# Patient Record
Sex: Male | Born: 1980 | Race: Black or African American | Hispanic: No | Marital: Married | Smoking: Never smoker
Health system: Southern US, Community
[De-identification: ages and names within clinical notes are randomized; demographics above are authoritative.]

## PROBLEM LIST (undated history)

## (undated) DIAGNOSIS — S83519A Sprain of anterior cruciate ligament of unspecified knee, initial encounter: Secondary | ICD-10-CM

## (undated) HISTORY — PX: TONSILLECTOMY: SUR1361

---

## 2011-04-02 ENCOUNTER — Emergency Department (HOSPITAL_COMMUNITY)
Admission: EM | Admit: 2011-04-02 | Discharge: 2011-04-02 | Disposition: A | Discharge status: Home / Self Care | Payer: Self-pay | Attending: Emergency Medicine | Admitting: Emergency Medicine

## 2011-04-02 DIAGNOSIS — T148XXA Other injury of unspecified body region, initial encounter: Secondary | ICD-10-CM | POA: Insufficient documentation

## 2011-04-02 DIAGNOSIS — M25569 Pain in unspecified knee: Secondary | ICD-10-CM | POA: Insufficient documentation

## 2011-04-02 DIAGNOSIS — R296 Repeated falls: Secondary | ICD-10-CM | POA: Insufficient documentation

## 2011-04-02 DIAGNOSIS — Y9367 Activity, basketball: Secondary | ICD-10-CM | POA: Insufficient documentation

## 2011-04-08 ENCOUNTER — Ambulatory Visit (INDEPENDENT_AMBULATORY_CARE_PROVIDER_SITE_OTHER): Payer: Self-pay | Admitting: Family Medicine

## 2011-04-08 ENCOUNTER — Encounter: Payer: Self-pay | Admitting: Family Medicine

## 2011-04-08 DIAGNOSIS — M25569 Pain in unspecified knee: Secondary | ICD-10-CM

## 2011-04-08 DIAGNOSIS — S83289A Other tear of lateral meniscus, current injury, unspecified knee, initial encounter: Secondary | ICD-10-CM

## 2011-04-08 NOTE — Assessment & Plan Note (Addendum)
Fragmentation of the lateral meniscus noted on MSK ultrasound, possible partial rupture of medial quadriceps tendon at insertion on the patella, but difficult to fully visualize due to prominent joint effusion. Noted to have chronic calcific change at the proximal patella consistent with previous quadriceps tendinitis/tendinopathy, cannot exclude avulsion of calcification from superior patella. Fitted with Body Helix knee sleeve sample which was comfortable in the office - should wear this at work & with activity. Place on light duty at work for next 3-weeks with avoidance of any deep squatting/knee bends and heavy lifting. Use stationary bike for exercises at least 3-4 days/week. Shown quad exercises including quad sets & straight leg raises. Ice at end of the day. Ibuprofen 800mg  tid x 10-days F/u 3-weeks for re-evaluation

## 2011-04-08 NOTE — Patient Instructions (Signed)
You have a small tear in your lateral meniscus & small partial tear of your quadriceps tendon. No running. You should try to ride stationary bike 30-40 mins 3-4 times a week. Wear knee support with activity and while at work. Do knee exercises - daily: - Quad sets  - Straight leg raises - 3 sets of 30 without weight Take ibuprofen 200mg  4 tabs 3-times a day with food x 10-days, then as needed. Ice at end of the day. Follow-up 3-weeks for re-evaluation.

## 2011-04-09 NOTE — Assessment & Plan Note (Signed)
Fragmentation of the lateral meniscus noted on MSK ultrasound. Plan as stated above.

## 2011-04-09 NOTE — Progress Notes (Signed)
  Subjective:    Patient ID: Alexander Jacobs, male    DOB: 06/17/1981, 30 y.o.   MRN: [id number]  HPI 30yo male to office for evaluation of L knee pain.  DOI: 04/01/11 while playing basketball.  Sustained varus/twisting stress to knee while foot was planted, he was hit in the knee by another player.  Felt a pop & had sensation like knee cap may have been laterally displaced.  Straightened knee & knee cap went back in place.  Had immediate swelling of the knee & pain.  Was able to weight bear following injury.  Evaluated at [facility] 04[facility]/11, no x-rays completed, dx with knee sprain, and instructed to follow-up.  Continues to have lateral knee pain & swelling.  Knee feeling very stiff & has been limping.  Increased pain with try to squat.  Has been using OTC knee sleeve & icing regularly.  Denies any clicking, catching, or locking, does feel slightly unstable.  Not taking any medications for it at this time.  Denies previous injury to knee.  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  No chronic medical conditions NKDA No taking any medications   Review of Systems Per HPI, otherwise negative    Objective:   Physical Exam GEN: AOx3, NAD, pleasant SKIN: no rashes/lesions MSK:  - Knees: Lt knee with (+)effusion & soft tissue swelling.  TTP along lateral joint line, medial patellar facet, & mild TTP along QT at superior pole of patella.  No medial joint line TTP.  Neg Lachman, mild lateral pain with varus/valgus stress testing, but no laxity.  Mildly (+)patellar apprehension.  Mild lateral discomfort with McMurray.  No calf tenderness.  Quad is slightly weak, extensor mechanism is intact.  Rt knee with FROM without pain, swelling, tenderness, weakness, or laxity.  Neg patellar apprehension. - Gait: walking with antalgic gait favoring left leg NEURO: sensation intact to light touch VASC: pulses +2/4 lower ext b/l  MSK U/S: Lt knee- prominent hypoechoic fluid collection in suprapatellar pouch consistent  with effusion.  Irregularity of medial portion of distal quad tendon with possible stump sign, concerning for possible partial quad tendon rupture - difficult to fully visualize due to prominent effusion.  Noted to have calcifications at superior pole of patella consistent with chronic tendinitis/tendinopathy with increased doppler flow, cannot exclude avulsion of calcification from superior pole of patella.  Normal appearing medial meniscus, Lateral meniscus with small area of fragmentation.  Normal appearing patellar tendon & tibial tubercle.  Images saved.      Assessment & Plan:

## 2011-09-23 ENCOUNTER — Ambulatory Visit (INDEPENDENT_AMBULATORY_CARE_PROVIDER_SITE_OTHER): Payer: Self-pay | Admitting: Sports Medicine

## 2011-09-23 VITALS — BP 110/70

## 2011-09-23 DIAGNOSIS — M25569 Pain in unspecified knee: Secondary | ICD-10-CM

## 2011-09-23 DIAGNOSIS — S83289A Other tear of lateral meniscus, current injury, unspecified knee, initial encounter: Secondary | ICD-10-CM

## 2011-09-23 NOTE — Assessment & Plan Note (Signed)
Much less pain  Can use prn otc meds  Use comp sleeve if playing ball

## 2011-09-23 NOTE — Progress Notes (Signed)
  Subjective:    Patient ID: Alexander Jacobs, male    DOB: [birth year], 30 y.o.   MRN: [id number]  HPI 1. Meniscal tear:  Here for follow up of small lateral meniscal tear of the L knee that occurred on 04/01/11.  At previous visit small lateral meniscal tear was visualized on MSK U/S along with possible partial quad tendon rupture.  Exam was difficult 2/2 to effusion at that time.  Since previous visit he has been doing well and says he is around 75% better.  He did "tweak" that knee about a month ago playing basketball when he landed and pivoted and somebody ran into his knee again.  He did have some mild pain at that time but the knee did not swell again.  The knee does not lock or give on him.  He has been wearing his helix brace intermittently.    Review of Systems     Objective:   Physical Exam Knee: Normal to inspection with no erythema or effusion or obvious bony abnormalities. Palpation normal with no warmth or joint line tenderness or patellar tenderness or condyle tenderness. ROM normal in flexion and extension and lower leg rotation. Lachmans with some laxity L>R, but good endpoint. Ligaments with solid consistent endpoints including ACL, PCL, LCL, MCL. Negative Mcmurray's and provocative meniscal tests. Non painful patellar compression. Patellar and quadriceps tendons unremarkable. Hamstring and quadriceps strength is normal.        Assessment & Plan:

## 2011-09-23 NOTE — Assessment & Plan Note (Signed)
He is doing well since the initial injury . -Instructed to work on quad strengthening to protect the knee from reinjury.  Instructed to do half squats for now. -Can return to vigorous activity when he has done strengthening exercises x1 month -He is to always wear helix brace with activity

## 2013-03-28 ENCOUNTER — Other Ambulatory Visit: Payer: Self-pay | Admitting: Pediatrics

## 2014-04-29 ENCOUNTER — Emergency Department (HOSPITAL_COMMUNITY)
Admission: EM | Admit: 2014-04-29 | Discharge: 2014-04-29 | Disposition: A | Discharge status: Home / Self Care | Payer: No Typology Code available for payment source | Attending: Emergency Medicine | Admitting: Emergency Medicine

## 2014-04-29 ENCOUNTER — Emergency Department (HOSPITAL_COMMUNITY): Payer: No Typology Code available for payment source

## 2014-04-29 ENCOUNTER — Encounter (HOSPITAL_COMMUNITY): Payer: Self-pay | Admitting: Emergency Medicine

## 2014-04-29 DIAGNOSIS — S8990XA Unspecified injury of unspecified lower leg, initial encounter: Secondary | ICD-10-CM | POA: Diagnosis present

## 2014-04-29 DIAGNOSIS — X500XXA Overexertion from strenuous movement or load, initial encounter: Secondary | ICD-10-CM | POA: Insufficient documentation

## 2014-04-29 DIAGNOSIS — S8392XA Sprain of unspecified site of left knee, initial encounter: Secondary | ICD-10-CM

## 2014-04-29 DIAGNOSIS — R61 Generalized hyperhidrosis: Secondary | ICD-10-CM | POA: Insufficient documentation

## 2014-04-29 DIAGNOSIS — R42 Dizziness and giddiness: Secondary | ICD-10-CM | POA: Diagnosis not present

## 2014-04-29 DIAGNOSIS — Y9239 Other specified sports and athletic area as the place of occurrence of the external cause: Secondary | ICD-10-CM | POA: Diagnosis not present

## 2014-04-29 DIAGNOSIS — IMO0002 Reserved for concepts with insufficient information to code with codable children: Secondary | ICD-10-CM | POA: Insufficient documentation

## 2014-04-29 DIAGNOSIS — Y9367 Activity, basketball: Secondary | ICD-10-CM | POA: Insufficient documentation

## 2014-04-29 DIAGNOSIS — S99919A Unspecified injury of unspecified ankle, initial encounter: Secondary | ICD-10-CM | POA: Diagnosis present

## 2014-04-29 DIAGNOSIS — R296 Repeated falls: Secondary | ICD-10-CM | POA: Insufficient documentation

## 2014-04-29 DIAGNOSIS — R209 Unspecified disturbances of skin sensation: Secondary | ICD-10-CM | POA: Insufficient documentation

## 2014-04-29 DIAGNOSIS — Y92838 Other recreation area as the place of occurrence of the external cause: Secondary | ICD-10-CM

## 2014-04-29 LAB — CBG MONITORING, ED: Glucose-Capillary: 80 mg/dL (ref 70–99)

## 2014-04-29 MED ORDER — IBUPROFEN 600 MG PO TABS: 600.0000 mg | ORAL_TABLET | Freq: Four times a day (QID) | ORAL | Status: AC | PRN

## 2014-04-29 MED ORDER — OXYCODONE-ACETAMINOPHEN 5-325 MG PO TABS: 1.0000 | ORAL_TABLET | Freq: Three times a day (TID) | ORAL | Status: DC | PRN

## 2014-04-29 MED ORDER — OXYCODONE-ACETAMINOPHEN 5-325 MG PO TABS
1.0000 | ORAL_TABLET | Freq: Once | ORAL | Status: AC
  Administered 2014-04-29: 1 via ORAL
  Filled 2014-04-29: qty 1

## 2014-04-29 NOTE — Discharge Instructions (Signed)
Please call and set-up an appointment with Dr. Eulah Pont, orthopedics,  Please rest and stay hydrated Please take Tylenol as needed for pain-no more than 4000 mg/4 g per day for this can lead to liver and Tylenol overdose which is fatal. Please continue to ice, elevate and rest Please strenuous or physical activity Please apply knee immobilizer and use crutches when walking When resting please prop leg up with pillows and apply pillows underneath the knee in a bent fashion to prevent stiffening of the joint Please continue to monitor symptoms closely and if symptoms are to worsen or change (fever greater than 101, chills, chest pain, shortness of breath, difficulty breathing, numbness, tingling, fall, injury, red streaks running down the leg, loss of sensation, warmth upon palpation) please report back to the ED immediately  Sprain A sprain is a tear in one of the strong, fibrous tissues that connect your bones (ligaments). The severity of the sprain depends on how much of the ligament is torn. The tear can be either partial or complete. CAUSES  Often, sprains are a result of a fall or an injury. The force of the impact causes the fibers of your ligament to stretch beyond their normal length. This excess tension causes the fibers of your ligament to tear. SYMPTOMS  You may have some loss of motion or increased pain within your normal range of motion. Other symptoms include:  Bruising.  Tenderness.  Swelling. DIAGNOSIS  In order to diagnose a sprain, your caregiver will physically examine you to determine how torn the ligament is. Your caregiver may also suggest an X-ray exam to make sure no bones are broken. TREATMENT  If your ligament is only partially torn, treatment usually involves keeping the injured area in a fixed position (immobilization) for a short period. To do this, your caregiver will apply a bandage, cast, or splint to keep the area from moving until it heals. For a partially torn  ligament, the healing process usually takes 2 to 3 weeks. If your ligament is completely torn, you may need surgery to reconnect the ligament to the bone or to reconstruct the ligament. After surgery, a cast or splint may be applied and will need to stay on for 4 to 6 weeks while your ligament heals. HOME CARE INSTRUCTIONS  Keep the injured area elevated to decrease swelling.  To ease pain and swelling, apply ice to your joint twice a day, for 2 to 3 days.  Put ice in a plastic bag.  Place a towel between your skin and the bag.  Leave the ice on for 15 minutes.  Only take over-the-counter or prescription medicine for pain as directed by your caregiver.  Do not leave the injured area unprotected until pain and stiffness go away (usually 3 to 4 weeks).  Do not allow your cast or splint to get wet. Cover your cast or splint with a plastic bag when you shower or bathe. Do not swim.  Your caregiver may suggest exercises for you to do during your recovery to prevent or limit permanent stiffness. SEEK IMMEDIATE MEDICAL CARE IF:  Your cast or splint becomes damaged.  Your pain becomes worse. MAKE SURE YOU:  Understand these instructions.  Will watch your condition.  Will get help right away if you are not doing well or get worse. Document Released: 12/12/2000 Document Revised: 03/08/2012 Document Reviewed: 12/27/2011 [facility] Patient Information 2014 [place], [place].   Emergency Department Resource Guide 1) Find a Doctor and Pay Out of Pocket Although you  won't have to find out who is covered by your insurance plan, it is a good idea to ask around and get recommendations. You will then need to call the office and see if the doctor you have chosen will accept you as a new patient and what types of options they offer for patients who are self-pay. Some doctors offer discounts or will set up payment plans for their patients who do not have insurance, but you will need to ask so you aren't  surprised when you get to your appointment.  2) Contact Your Local Health Department Not all health departments have doctors that can see patients for sick visits, but many do, so it is worth a call to see if yours does. If you don't know where your local health department is, you can check in your phone book. The CDC also has a tool to help you locate your state's health department, and many state websites also have listings of all of their local health departments.  3) Find a Walk-in Clinic If your illness is not likely to be very severe or complicated, you may want to try a walk in clinic. These are popping up all over the country in pharmacies, drugstores, and shopping centers. They're usually staffed by nurse practitioners or physician assistants that have been trained to treat common illnesses and complaints. They're usually fairly quick and inexpensive. However, if you have serious medical issues or chronic medical problems, these are probably not your best option.  No Primary Care Doctor: - Call Health Connect at  [phone] - they can help you locate a primary care doctor that  accepts your insurance, provides certain services, etc. - Physician Referral Service- [phone]  Chronic Pain Problems: Organization         Address  Phone   Notes  [facility]  [phone] Patients need to be referred by their primary care doctor.   Medication Assistance: Organization         Address  Phone   Notes  [facility] Medication [facility] [address] [place], [place] [address] [phone] --Must be a resident of [facility] -- Must have NO insurance coverage whatsoever (no Medicaid/ Medicare, etc.) -- The pt. MUST have a primary care doctor that directs their care regularly and follows them in the community   MedAssist  [phone]) [phone]   [employer]  [phone]    Agencies that provide inexpensive medical care: Organization          Address  Phone   Notes  [facility]  [phone]   [facility]    [phone]   [facility] [address] [place], [place] [address] [phone]   [facility] [address], [place] [phone]   [facility]anned Parenthood    [phone]) [phone]   [facility]    [phone]   [facility]  [address], [place] Phone:  [phone], Fax:  [phone] Hours of Operation:  9 am - 6 pm, M-F.  Also accepts Medicaid/Medicare and self-pay.  [facility]  [address], [place] Phone: [phone], Fax: [phone]. Hours of Operation:  8:30 am - 5:30 pm, M-F.  Also accepts Medicaid and self-pay.  [facility] [place] [address], [place] Phone: [phone]) [phone]   [facility] [address], [place], [place] [phone], Ext. [phone] Mondays & Thursdays: 7-9 AM.  First 15 patients are seen on a first come, first serve basis.    Medicaid-accepting [facility] Providers:  Organization         Address  Phone   Notes  [facility] [address], [place] [phone] Also accepts self-pay patients.  [facility] [address], [place]  [phone]   [facility] [address], [place] [phone]   [facility] [address] [place] [address], [place] [phone]   Renaye RakersVeita Bland [address], [place]   [phone]) [phone] Only accepts [place] Access [place] patients after they have their name applied to their card.   Self-Pay (no insurance) in [facility] [place]:  Organization         Address  Phone   Notes  Sickle Cell Patients, [facility] [address], [place] [phone]   [facility] [address], [place] [phone]     [facility]  [address], [place] [phone]) [phone]   [facility]/Dr. Osei-Bonsu  [address] [place] [address], [place] or [phone], [place] [phone]) [phone] Phone number for both [place] and [place] locations is the same.  [facility] [address], [place] [phone]) [phone]   [facility] [place] [address] [place] [address], [place] or [address] [phone]   [facility] [address], [place] [phone], phone; [phone], fax Sees patients 1st and 3rd Saturday of every month.  Must not qualify for public or private insurance (i.e. Medicaid, Medicare, [place] Health Choice, Veterans' Benefits)  Household income should be no more than 200% of the poverty level The clinic cannot treat you if you are pregnant or think you are pregnant  Sexually transmitted diseases are not treated at the clinic.    Dental Care: Organization         Address  Phone  Notes  [facility] [address], [place] [phone] Accepts children up to age 31 who are enrolled in [place]icaid or [place] Health Choice; pregnant women with a Medicaid card; and children who have applied for Medicaid or [place] Health Choice, but were declined, whose parents can pay a reduced fee at time of service.  [facility] [place]  [address], [place] [phone]) [phone] Accepts children up to age 31 who are enrolled in [place] or [place] Health Choice; pregnant women with a Medicaid card; and children who have applied for Medicaid or [place] Health Choice, but were declined, whose parents can pay a reduced fee at time of service.  [facility]  [address], [place] [phone] Patients are seen by appointment only. Walk-ins are not accepted. [facility] will see patients 3918  years of age and older. Monday - Tuesday (8am-5pm) Most Wednesdays (8:30-5pm) $30 per visit, cash only  [facility]  [address], [facility] [phone] Patients are seen by appointment only. Walk-ins are not accepted. [facility] will see patients 31 years of age and older. One Wednesday Evening (Monthly: Volunteer Based).  $30 per visit, cash only  [employer]  [phone] for adults; Children under age 42, call Graduate Pediatric Dentistry at [phone]. Children aged 66-14, please call [phone] to request a pediatric application.  Dental services are provided in all areas of dental care including fillings, crowns and bridges, complete and partial dentures, implants, gum treatment, root canals, and extractions. Preventive care is also provided. Treatment is provided to both adults and children. Patients are selected via a lottery and there is often a waiting list.   [facility] [address], [place]  [phone] [email or web address]   [facility] [address] [place], [place] [phone], Ext. [phone] Second and Fourth Thursday of each month, opens at 6:30 AM; Clinic ends at 9 AM.  Patients are seen on a first-come first-served basis, and a limited number are seen during each clinic.   [facility]  [address] [place]ard D[place]er [place], [place] [phone]   Eligibility Requirements You must have lived in [place], [place], or [place] counties for at least the last three months.   You cannot be eligible for state or federal sponsored [employer], including [employer], [place], or [employer].   You generally cannot be eligible for healthcare insurance through your employer.    How to apply: Eligibility screenings are held every Tuesday and Wednesday afternoon from 1:00 pm until 4:00 pm. You do not need an appointment for the interview!  [facility]  [address] [place], [place]   [place]  [place] Department  [place]  [phone]    Behavioral Health Resources in the Community: Intensive Outpatient Programs Organization         Address  Phone  Notes  [place]. [address], [place], [place] [phone]   [facility] [address] [place], [place]   ADS: Alcohol & Drug S[facility]cs [address] [place], [place]   [place] [address],  [place], [place] or [phone]   Substance Abuse Resources Organization         Address  Phone  Notes  Alcohol and Drug Services  [phone]   [place]  [phone]   The [place]   Chinita Pester  [phone]   Residential & Outpatient Substance Abuse Program  [phone]   Psychological Services Organization         Address  Phone  Notes  [facility] [place]  [place]  [phone]   [place] [address], [place] or [phone]    Mobile Crisis Teams Organization         Address  Phone  Notes  [facility], Mobile Crisis Care Unit  [phone]   [facility]  [address] [place], [place]   [place] Levels [address] [place] [phone]    Self-Help/Support Groups Organization         Address  Phone             Notes  [place]. of [place] - variety of support groups  [place] Call for more information  Narcotics Anonymous (NA), [facility]aring [facility] [address], [employer] [place]  2 meetings at this location   Special educational needs teacher  Address  Phone  Notes  [facility] [address],    [place]   [phone]   [facility]  [address], [place] [address]04, [place], [place]   [place], [place] [phone] Admissions: 8am-3pm M-F  Incentives Substance [place] [address],    [place], [place] [phone]   [facility] [address] [place], [place], [place]   [facility] [address]  [place], [place]   [facility] - Intensive Outpatient [address], [place], [place]   [facility] ([place] [address].,  [place], [place] [phone] or [phone]   [facility] Services (RTS) [address], [place], [place] Accepts Medicaid  [facility] [place] [address],  [place] [phone] Substance Abuse/Addiction Treatment   [facility] Organization         Address  Phone  Notes  [facility]  [phone]   Domenic Schwab, PhD [address] [place], [place]   [phone] or [phone]   [place] [address] [place], [place] [phone]   [facility] [address], [place], [place] [phone] Insurance/Medicaid/sponsorship through [facility] [address]                                    [place], [place] [phone] [place] [address] Street[place], [place] [phone]    Dr. Adele Schilder  [phone]   [facility] Dept. 1) [address] [place] 2) [place] 3)  [address], [place] [phone]  [phone]   [place] [phone] or [phone] (After Hours)

## 2014-04-29 NOTE — ED Notes (Signed)
Due to heart rate and diaphoresis, pt was given 500cc po soda and ice, a sandwich,  Crackers and peanut butter. Pt remains alert and appropriate

## 2014-04-29 NOTE — ED Notes (Signed)
Ambulated in hall on crutches, PA observed. Pt was shown how to check his pulse. Will follow up by locating a PCP and completing a physical

## 2014-04-29 NOTE — ED Notes (Signed)
Pt c/o lt knee pain after falling and twisting it on Thursday.

## 2014-04-29 NOTE — ED Notes (Signed)
Pt denies pain or shortness of breath. Continues to take po fluids. Family at bedside

## 2014-04-29 NOTE — ED Notes (Signed)
Pt c/o feeling lightheaded, dizzy, sweaty. Denies chest pain or nausea. VS WNL. Continue po fluids. PA and EDP at bedside

## 2014-04-29 NOTE — ED Provider Notes (Signed)
CSN: [id number]     Arrival date & time 04/29/14  [id number] History   First MD Initiated Contact with Patient 04/29/14 1006     Chief Complaint  Patient presents with  . Knee Pain     (Consider location/radiation/quality/duration/timing/severity/associated sxs/prior Treatment) Patient is a 33 y.o. male presenting with knee pain. The history is provided by the patient. No language interpreter was used.  Knee Pain Associated symptoms: no fever   Alexander Jacobs is a 33 year old male with no known significant past medical history presenting to the ED with left knee pain. Patient reports that the knee pain has been ongoing since Thursday-reported that while playing basketball patient landed on his left knee an awkward manner, landing on the lateral aspects. Reported that as he fell he heard a "crunching" sound coming from the knee. Stated that the pain is localized to the medial aspect of the left knee with soreness to touch and shooting pain with motion that radiates down to his calf. Patient reported that he has been not been using anything for the pain. Stated that in 2012 patient had a meniscal tear in his left knee that was not surgically repaired, patient went through physical therapy. Denied numbness, tingling, loss of sensation, warmth upon palpation, erythema. PCP none   History reviewed. No pertinent past medical history. History reviewed. No pertinent past surgical history. History reviewed. No pertinent family history. History  Substance Use Topics  . Smoking status: Never Smoker   . Smokeless tobacco: Not on file  . Alcohol Use: No    Review of Systems  Constitutional: Negative for fever and chills.  Eyes: Negative for visual disturbance.  Respiratory: Negative for chest tightness and shortness of breath.   Cardiovascular: Negative for chest pain.  Musculoskeletal: Positive for arthralgias (Left knee pain). Negative for gait problem and myalgias.  Neurological: Negative for weakness  and numbness.  All other systems reviewed and are negative.     Allergies  Percocet  Home Medications   Prior to Admission medications   Not on File   BP 112/61  Pulse 49  Temp(Src) 98 F (36.7 C) (Oral)  Resp 18  SpO2 100% Physical Exam  Nursing note and vitals reviewed. Constitutional: He is oriented to person, place, and time. He appears well-developed and well-nourished. No distress.  HENT:  Head: Normocephalic and atraumatic.  Eyes: Conjunctivae and EOM are normal. Right eye exhibits no discharge. Left eye exhibits no discharge.  Neck: Normal range of motion. Neck supple.  Cardiovascular: Normal rate, regular rhythm and normal heart sounds.  Exam reveals no friction rub.   No murmur heard. Pulses:      Radial pulses are 2+ on the right side, and 2+ on the left side.       Dorsalis pedis pulses are 2+ on the right side, and 2+ on the left side.  Pulmonary/Chest: Effort normal and breath sounds normal. No respiratory distress. He has no wheezes. He has no rales.  Musculoskeletal: He exhibits edema and tenderness.       Left knee: He exhibits decreased range of motion and swelling. He exhibits no effusion, no ecchymosis, no deformity, no laceration and no erythema. Tenderness found. Medial joint line and MCL tenderness noted.       Legs: Mild swelling localized to the medial aspect of the left knee - negative erythema, inflammation, lesions, sores, warmth upon palpation, red streaks. Negative signs of septic joint. Pain upon palpation to the medial aspect. Negative crepitus upon palpation. Flexion and  extension intact - minimal - secondary to pain, negative anterior and posterior draw sign. Negative varus. Positive pain with valgus tension.  Full ROM to the left hip, ankle, and digits of the left foot.    Neurological: He is alert and oriented to person, place, and time. No cranial nerve deficit. He exhibits normal muscle tone. Coordination normal.  Cranial nerves III-XII  grossly intact Strength 5+/5+ to lower extremities bilaterally with resistance applied, equal distribution noted Strength intact to digits of the left feet bilaterally with equal distribution Sensation intact with differentiation sharp and dull touch Gait proper - mild limp when applying pressure to the left leg  Skin: Skin is warm and dry. No rash noted. He is not diaphoretic. No erythema.    ED Course  Procedures (including critical care time)  12:56 PM Patient not feeling well from the percocets -prescription discontinued. Patient's heart rate dropped from 75 beats per minute to approximately 55 beats per minute. Patient reported that he was feeling diaphoretic, dizzy, tingling sensations all over his body. Patient lay down, was given ginger ale and something to eat. Patient reported he has not eaten all day. Attending, Dr. Wilkie AyeHorton at bedside-assess patient recommended patient to be monitored. Reported that this is normal when taking pain medications. CBG 80.  1:56 PM Patient has been given food and fluids area patient has been monitored for at least one hour and has been hooked up to the monitor. Patient appears well. Patient denied dizziness, chest pain, shortness of breath, difficulty breathing, blurred vision, lightheadedness. Patient reported that he is feeling well. Stated that he is able to walk well. Ambulated well without difficulty. Patient appears well. Vitals rechecked. Patient stable, afebrile. Patient discharged home. Percocet allergy put computer for future reference.  Labs Review Labs Reviewed  CBG MONITORING, ED    Imaging Review Ct Knee Left Wo Contrast  04/29/2014   CLINICAL DATA:  Knee pain.  Twisting injury.  EXAM: CT OF THE LEFT KNEE WITHOUT CONTRAST  TECHNIQUE: Multidetector CT imaging was performed according to the standard protocol. Multiplanar CT image reconstructions were also generated.  COMPARISON:  Radiographs 04/29/2014.  FINDINGS: Age advanced degenerative  changes with joint space narrowing and osteophytic spurring. No acute fracture is identified. No osteochondral lesion. There is a moderate to large joint effusion. Calcifications are noted in the distal quadriceps tendon. No obvious quadriceps or patellar tendon rupture.  IMPRESSION: No acute bony findings.  Age advanced degenerative changes.  Moderate to large joint effusion.   Electronically Signed   By: Loralie ChampagneMark  Gallerani M.D.   On: 04/29/2014 11:45   Dg Knee Complete 4 Views Left  04/29/2014   CLINICAL DATA:  Medial left knee pain.  Fall 2 days ago.  EXAM: LEFT KNEE - COMPLETE 4+ VIEW  COMPARISON:  None.  FINDINGS: No acute fracture or dislocation is identified. There is a moderately large knee joint effusion. Mild tricompartmental osteophyte formation is present. A prominent superior patellar enthesophyte is noted.  IMPRESSION: 1. No acute fracture or dislocation identified. 2. Moderately large knee joint effusion. 3. Mild tricompartmental osteoarthrosis.   Electronically Signed   By: Sebastian AcheAllen  Grady   On: 04/29/2014 10:28     EKG Interpretation None      MDM   Final diagnoses:  Sprain of left knee  Injury of ligament of knee   Medications  oxyCODONE-acetaminophen (PERCOCET/ROXICET) 5-325 MG per tablet 1 tablet (1 tablet Oral Given 04/29/14 1206)   Filed Vitals:   04/29/14 1248 04/29/14 1259 04/29/14  1301 04/29/14 1325  BP: 120/65 127/80 113/65 112/61  Pulse:  70 56 49  Temp:      TempSrc:      Resp:  20 18 18   SpO2:  100%      Plain films of the left knee identified no acute fracture or dislocation. A moderate large knee joint effusion is identified associated with mild tricompartmental osteoarthrosis. Patient neurovascularly intact. Patient is able to apply pressure to the left knee and able to ambulate with mild limp. Strength intact with equal distribution. Negative posterior and anterior draw sign. Negative crepitus with motion. Flexion and extension is intact with mild discomfort  noted. CT left knee without contrast negative for acute bony abnormalities-negative findings of tibial plateau fracture. Degenerative changes identified with moderate to large joint effusion. Negative findings of acute osseous injury. Negative findings of quadriceps or patellar tendon rupture. Patient neurovascularly intact. Negative focal neurological deficits noted. Patient able to angulate onlay. Cannot rule out possible MCL or meniscal injury. Patient placed in knee immobilizer. Crutches administered. Discussed with patient to rest, ice, elevate. At first this provider discharged the patient with pain medications, but patient became hypotensive and bradycardia with dizziness while on the medication that was given while in the ED setting - medications discontinued, allergy placed in patient's chart and patient discharged home with Ibuprofen. Referred patient to orthopedics discussed with patient that he will most likely need a MRI of his left knee within the near future. Discussed with patient to avoid any physical or strenuous activity. Discussed with patient to closely monitor symptoms and if symptoms are to worsen or change to report back to the ED - strict return instructions given.  Patient agreed to plan of care, understood, all questions answered.   Alexander Mutton, PA-C 04/29/14 2048

## 2014-04-30 NOTE — ED Provider Notes (Signed)
Medical screening examination/treatment/procedure(s) were performed by non-physician practitioner and as supervising physician I was immediately available for consultation/collaboration.   EKG Interpretation None       Courtney F Horton, MD 04/30/14 0707 

## 2014-05-29 DIAGNOSIS — S83519A Sprain of anterior cruciate ligament of unspecified knee, initial encounter: Secondary | ICD-10-CM

## 2014-05-29 HISTORY — DX: Sprain of anterior cruciate ligament of unspecified knee, initial encounter: S83.519A

## 2014-06-04 NOTE — H&P (Signed)
  [facility] [address]   [address] [place],  [address] [phone] A Division of [facility]  Loreta Ave, M.D.   Robert A. Thurston Hole, M.D.   Burnell Blanks, M.D.   Eulas Post, M.D.   Lunette Stands, M.D Jewel Baize. Eulah Pont, M.D.  Buford Dresser, M.D.  Estell Harpin, M.D.    Melina Fiddler, M.D. Mary L. Isidoro Donning, PA-C  Kirstin A. Shepperson, PA-C  Josh [place] Horn, PA-C [place], [place]   RE: Alexander Jacobs, Alexander Jacobs   [id number]      DOB: [birth year] PROGRESS NOTE: 05-17-14 Reason for visit:  Follow-up MRI left knee. History of present illness: He suffered an injury on 04/27/14 playing basketball to his left knee. His pain is improved swelling has gone down. He has been using crutches.   Please see associated documentation for this clinic visit for further past medical, family, surgical and social history, review of systems, and exam findings as this was reviewed by me.  EXAMINATION: Well appearing male in no apparent distress. Left knee has tenderness and residual effusion, unstable to [place]man now that he is not guarding as much. He is neurovascularly intact distally.  IMAGING: MRI demonstrates posterior medial and lateral meniscus tears chondral injury to the lateral femoral condyle and ACL tear.  ASSESSMENT/PLAN: 1. I had a lengthy discussion with him. He is very active and wants to return to basketball. 2. Given his injuries I recommend meniscal debridement versus repair with possible microfracture lateral femoral condyle and ACL reconstruction, I discussed both allo and autograft and he would like an allograft reconstruction. 3. I discuss with him that should he require microfracture or meniscal repair he will be non-weightbearing for 6 weeks.  Jewel Baize.  Eulah Pont, M.D.  Electronically verified by Jewel Baize. Eulah Pont, M.D. TDM:kah D 05-17-14 T 05-18-14

## 2014-06-05 ENCOUNTER — Encounter (HOSPITAL_BASED_OUTPATIENT_CLINIC_OR_DEPARTMENT_OTHER): Payer: Self-pay | Admitting: *Deleted

## 2014-06-09 ENCOUNTER — Ambulatory Visit (HOSPITAL_BASED_OUTPATIENT_CLINIC_OR_DEPARTMENT_OTHER): Payer: No Typology Code available for payment source | Admitting: Anesthesiology

## 2014-06-09 ENCOUNTER — Encounter (HOSPITAL_BASED_OUTPATIENT_CLINIC_OR_DEPARTMENT_OTHER): Payer: Self-pay | Admitting: *Deleted

## 2014-06-09 ENCOUNTER — Encounter (HOSPITAL_BASED_OUTPATIENT_CLINIC_OR_DEPARTMENT_OTHER): Payer: No Typology Code available for payment source | Admitting: Anesthesiology

## 2014-06-09 ENCOUNTER — Ambulatory Visit (HOSPITAL_BASED_OUTPATIENT_CLINIC_OR_DEPARTMENT_OTHER)
Admission: RE | Admit: 2014-06-09 | Discharge: 2014-06-09 | Disposition: A | Discharge status: Home / Self Care | Payer: No Typology Code available for payment source | Source: Ambulatory Visit | Attending: Orthopedic Surgery | Admitting: Orthopedic Surgery

## 2014-06-09 ENCOUNTER — Encounter (HOSPITAL_BASED_OUTPATIENT_CLINIC_OR_DEPARTMENT_OTHER)
Admission: RE | Disposition: A | Discharge status: Home / Self Care | Payer: Self-pay | Source: Ambulatory Visit | Attending: Orthopedic Surgery

## 2014-06-09 DIAGNOSIS — M2350 Chronic instability of knee, unspecified knee: Secondary | ICD-10-CM | POA: Insufficient documentation

## 2014-06-09 DIAGNOSIS — M13169 Monoarthritis, not elsewhere classified, unspecified knee: Secondary | ICD-10-CM | POA: Diagnosis not present

## 2014-06-09 DIAGNOSIS — M23302 Other meniscus derangements, unspecified lateral meniscus, unspecified knee: Secondary | ICD-10-CM | POA: Insufficient documentation

## 2014-06-09 DIAGNOSIS — M23305 Other meniscus derangements, unspecified medial meniscus, unspecified knee: Secondary | ICD-10-CM | POA: Diagnosis not present

## 2014-06-09 HISTORY — DX: Sprain of anterior cruciate ligament of unspecified knee, initial encounter: S83.519A

## 2014-06-09 HISTORY — PX: KNEE ARTHROSCOPY WITH ANTERIOR CRUCIATE LIGAMENT (ACL) REPAIR WITH HAMSTRING GRAFT: SHX5645

## 2014-06-09 LAB — POCT HEMOGLOBIN-HEMACUE: Hemoglobin: 15.8 g/dL (ref 13.0–17.0)

## 2014-06-09 SURGERY — KNEE ARTHROSCOPY WITH ANTERIOR CRUCIATE LIGAMENT (ACL) REPAIR WITH HAMSTRING GRAFT
Anesthesia: General | Site: Knee | Laterality: Left

## 2014-06-09 MED ORDER — LACTATED RINGERS IV SOLN
INTRAVENOUS | Status: DC
  Administered 2014-06-09 (×2): via INTRAVENOUS

## 2014-06-09 MED ORDER — ACETAMINOPHEN 500 MG PO TABS
1000.0000 mg | ORAL_TABLET | Freq: Once | ORAL | Status: AC
  Administered 2014-06-09: 1000 mg via ORAL

## 2014-06-09 MED ORDER — HYDROMORPHONE HCL PF 1 MG/ML IJ SOLN
INTRAMUSCULAR | Status: AC
Start: 2014-06-09 — End: 2014-06-09
  Filled 2014-06-09: qty 1

## 2014-06-09 MED ORDER — HYDROMORPHONE HCL PF 1 MG/ML IJ SOLN
INTRAMUSCULAR | Status: AC
  Filled 2014-06-09: qty 1

## 2014-06-09 MED ORDER — ONDANSETRON HCL 4 MG PO TABS: 4.0000 mg | ORAL_TABLET | Freq: Three times a day (TID) | ORAL | Status: AC | PRN

## 2014-06-09 MED ORDER — CEFAZOLIN SODIUM 1-5 GM-% IV SOLN
INTRAVENOUS | Status: AC
  Filled 2014-06-09: qty 100

## 2014-06-09 MED ORDER — PROPOFOL 10 MG/ML IV BOLUS
INTRAVENOUS | Status: DC | PRN
  Administered 2014-06-09: 200 mg via INTRAVENOUS

## 2014-06-09 MED ORDER — LIDOCAINE HCL (CARDIAC) 20 MG/ML IV SOLN
INTRAVENOUS | Status: DC | PRN
  Administered 2014-06-09: 40 mg via INTRAVENOUS

## 2014-06-09 MED ORDER — SODIUM CHLORIDE 0.9 % IR SOLN
Status: DC | PRN
  Administered 2014-06-09: 9000 mL

## 2014-06-09 MED ORDER — METHYLPREDNISOLONE ACETATE 80 MG/ML IJ SUSP
INTRAMUSCULAR | Status: DC | PRN
  Administered 2014-06-09: 80 mg

## 2014-06-09 MED ORDER — MIDAZOLAM HCL 5 MG/5ML IJ SOLN
INTRAMUSCULAR | Status: DC | PRN
  Administered 2014-06-09: 2 mg via INTRAVENOUS

## 2014-06-09 MED ORDER — FENTANYL CITRATE 0.05 MG/ML IJ SOLN
INTRAMUSCULAR | Status: AC
  Filled 2014-06-09: qty 6

## 2014-06-09 MED ORDER — BUPIVACAINE-EPINEPHRINE (PF) 0.5% -1:200000 IJ SOLN
INTRAMUSCULAR | Status: DC | PRN
  Administered 2014-06-09: 25 mL via PERINEURAL

## 2014-06-09 MED ORDER — FENTANYL CITRATE 0.05 MG/ML IJ SOLN
50.0000 ug | INTRAMUSCULAR | Status: DC | PRN
  Administered 2014-06-09: 100 ug via INTRAVENOUS

## 2014-06-09 MED ORDER — MIDAZOLAM HCL 2 MG/2ML IJ SOLN
1.0000 mg | INTRAMUSCULAR | Status: DC | PRN
  Administered 2014-06-09: 2 mg via INTRAVENOUS

## 2014-06-09 MED ORDER — ONDANSETRON HCL 4 MG/2ML IJ SOLN
INTRAMUSCULAR | Status: DC | PRN
  Administered 2014-06-09: 4 mg via INTRAVENOUS

## 2014-06-09 MED ORDER — MIDAZOLAM HCL 2 MG/2ML IJ SOLN
INTRAMUSCULAR | Status: AC
  Filled 2014-06-09: qty 2

## 2014-06-09 MED ORDER — BUPIVACAINE HCL (PF) 0.25 % IJ SOLN
INTRAMUSCULAR | Status: AC
  Filled 2014-06-09: qty 30

## 2014-06-09 MED ORDER — FENTANYL CITRATE 0.05 MG/ML IJ SOLN
INTRAMUSCULAR | Status: AC
  Filled 2014-06-09: qty 2

## 2014-06-09 MED ORDER — ACETAMINOPHEN 500 MG PO TABS
ORAL_TABLET | ORAL | Status: AC
  Filled 2014-06-09: qty 2

## 2014-06-09 MED ORDER — OXYCODONE HCL 5 MG PO TABS: 10.0000 mg | ORAL_TABLET | ORAL | Status: AC | PRN

## 2014-06-09 MED ORDER — DOCUSATE SODIUM 100 MG PO CAPS: 100.0000 mg | ORAL_CAPSULE | Freq: Two times a day (BID) | ORAL | Status: AC

## 2014-06-09 MED ORDER — HYDROMORPHONE HCL PF 1 MG/ML IJ SOLN
0.2500 mg | INTRAMUSCULAR | Status: DC | PRN
Start: 2014-06-09 — End: 2014-06-09
  Administered 2014-06-09 (×3): 0.5 mg via INTRAVENOUS

## 2014-06-09 MED ORDER — ONDANSETRON HCL 4 MG/2ML IJ SOLN
INTRAMUSCULAR | Status: AC
  Filled 2014-06-09: qty 2

## 2014-06-09 MED ORDER — ASPIRIN 81 MG PO TABS: 81.0000 mg | ORAL_TABLET | Freq: Every day | ORAL | Status: AC

## 2014-06-09 MED ORDER — MIDAZOLAM HCL 2 MG/ML PO SYRP: 12.0000 mg | ORAL_SOLUTION | Freq: Once | ORAL | Status: DC | PRN

## 2014-06-09 MED ORDER — CEFAZOLIN SODIUM-DEXTROSE 2-3 GM-% IV SOLR: 2.0000 g | INTRAVENOUS | Status: DC

## 2014-06-09 MED ORDER — DEXTROSE-NACL 5-0.45 % IV SOLN: 100.0000 mL/h | INTRAVENOUS | Status: DC

## 2014-06-09 MED ORDER — BUPIVACAINE HCL (PF) 0.5 % IJ SOLN
INTRAMUSCULAR | Status: AC
  Filled 2014-06-09: qty 30

## 2014-06-09 MED ORDER — FENTANYL CITRATE 0.05 MG/ML IJ SOLN
INTRAMUSCULAR | Status: DC | PRN
  Administered 2014-06-09 (×2): 50 ug via INTRAVENOUS

## 2014-06-09 MED ORDER — METOCLOPRAMIDE HCL 5 MG/ML IJ SOLN: 10.0000 mg | Freq: Once | INTRAMUSCULAR | Status: DC | PRN

## 2014-06-09 MED ORDER — DEXAMETHASONE SODIUM PHOSPHATE 4 MG/ML IJ SOLN
INTRAMUSCULAR | Status: DC | PRN
  Administered 2014-06-09: 10 mg via INTRAVENOUS

## 2014-06-09 MED ORDER — METHYLPREDNISOLONE ACETATE 80 MG/ML IJ SUSP
INTRAMUSCULAR | Status: AC
  Filled 2014-06-09: qty 1

## 2014-06-09 SURGICAL SUPPLY — 89 items
ANCHOR BUTTON TIGHTROPE BTB (Anchor) ×3 IMPLANT
BANDAGE ELASTIC 6 VELCRO ST LF (GAUZE/BANDAGES/DRESSINGS) ×3 IMPLANT
BANDAGE ESMARK 6X9 LF (GAUZE/BANDAGES/DRESSINGS) IMPLANT
BENZOIN TINCTURE PRP APPL 2/3 (GAUZE/BANDAGES/DRESSINGS) ×3 IMPLANT
BLADE 4.2CUDA (BLADE) IMPLANT
BLADE CUDA 5.5 (BLADE) IMPLANT
BLADE CUDA GRT WHITE 3.5 (BLADE) IMPLANT
BLADE CUTTER GATOR 3.5 (BLADE) ×3 IMPLANT
BLADE CUTTER MENIS 5.5 (BLADE) IMPLANT
BLADE GREAT WHITE 4.2 (BLADE) ×2 IMPLANT
BLADE GREAT WHITE 4.2MM (BLADE) ×1
BLADE SURG 15 STRL LF DISP TIS (BLADE) ×1 IMPLANT
BLADE SURG 15 STRL SS (BLADE) ×2
BNDG ESMARK 6X9 LF (GAUZE/BANDAGES/DRESSINGS)
BUR OVAL 4.0 (BURR) IMPLANT
BUR OVAL 6.0 (BURR) ×3 IMPLANT
CANISTER SUCT 3000ML (MISCELLANEOUS) IMPLANT
CHLORAPREP W/TINT 26ML (MISCELLANEOUS) ×3 IMPLANT
CLOSURE WOUND 1/2 X4 (GAUZE/BANDAGES/DRESSINGS) ×1
COVER TABLE BACK 60X90 (DRAPES) ×3 IMPLANT
CUFF TOURNIQUET SINGLE 24IN (TOURNIQUET CUFF) ×3 IMPLANT
CUFF TOURNIQUET SINGLE 34IN LL (TOURNIQUET CUFF) IMPLANT
CUTTER MENISCUS  4.2MM (BLADE)
CUTTER MENISCUS 4.2MM (BLADE) IMPLANT
DECANTER SPIKE VIAL GLASS SM (MISCELLANEOUS) IMPLANT
DRAPE ARTHROSCOPY W/POUCH 114 (DRAPES) ×3 IMPLANT
DRAPE U-SHAPE 47X51 STRL (DRAPES) ×3 IMPLANT
DRILL FLIPCUTTER II 8.5MM (INSTRUMENTS) ×1 IMPLANT
DRSG EMULSION OIL 3X3 NADH (GAUZE/BANDAGES/DRESSINGS) ×3 IMPLANT
ELECT REM PT RETURN 9FT ADLT (ELECTROSURGICAL) ×3
ELECTRODE REM PT RTRN 9FT ADLT (ELECTROSURGICAL) ×1 IMPLANT
FIBERSTICK 2 (SUTURE) ×3 IMPLANT
FLIPCUTTER II 8.5MM (INSTRUMENTS) ×3
GAUZE SPONGE 4X4 12PLY STRL (GAUZE/BANDAGES/DRESSINGS) ×6 IMPLANT
GLOVE BIO SURGEON STRL SZ7.5 (GLOVE) ×3 IMPLANT
GLOVE BIO SURGEON STRL SZ8 (GLOVE) ×3 IMPLANT
GLOVE BIOGEL PI IND STRL 7.0 (GLOVE) ×1 IMPLANT
GLOVE BIOGEL PI IND STRL 8 (GLOVE) ×2 IMPLANT
GLOVE BIOGEL PI INDICATOR 7.0 (GLOVE) ×2
GLOVE BIOGEL PI INDICATOR 8 (GLOVE) ×4
GLOVE ECLIPSE 6.5 STRL STRAW (GLOVE) ×3 IMPLANT
GLOVE ORTHO TXT STRL SZ7.5 (GLOVE) IMPLANT
GOWN STRL REUS W/ TWL LRG LVL3 (GOWN DISPOSABLE) ×2 IMPLANT
GOWN STRL REUS W/ TWL XL LVL3 (GOWN DISPOSABLE) ×1 IMPLANT
GOWN STRL REUS W/TWL LRG LVL3 (GOWN DISPOSABLE) ×4
GOWN STRL REUS W/TWL XL LVL3 (GOWN DISPOSABLE) ×2
IMMOBILIZER KNEE 22 UNIV (SOFTGOODS) ×3 IMPLANT
IMMOBILIZER KNEE 24 THIGH 36 (MISCELLANEOUS) ×1 IMPLANT
IMMOBILIZER KNEE 24 UNIV (MISCELLANEOUS) ×3
KIT BUTTON TIGHTROPE ABS 8X12 (Anchor) ×3 IMPLANT
KIT IMPLANT TIGHTROPE ABS OPEN (Anchor) ×3 IMPLANT
KIT TRANSTIBIAL (DISPOSABLE) IMPLANT
KNEE WRAP E Z 3 GEL PACK (MISCELLANEOUS) ×3 IMPLANT
MANIFOLD NEPTUNE II (INSTRUMENTS) ×3 IMPLANT
NS IRRIG 1000ML POUR BTL (IV SOLUTION) ×3 IMPLANT
PACK ARTHROSCOPY DSU (CUSTOM PROCEDURE TRAY) ×3 IMPLANT
PACK BASIN DAY SURGERY FS (CUSTOM PROCEDURE TRAY) ×3 IMPLANT
PAD CAST 4YDX4 CTTN HI CHSV (CAST SUPPLIES) ×1 IMPLANT
PADDING CAST COTTON 4X4 STRL (CAST SUPPLIES) ×2
PADDING CAST COTTON 6X4 STRL (CAST SUPPLIES) ×3 IMPLANT
PENCIL BUTTON HOLSTER BLD 10FT (ELECTRODE) IMPLANT
SET ARTHROSCOPY TUBING (MISCELLANEOUS) ×2
SET ARTHROSCOPY TUBING LN (MISCELLANEOUS) ×1 IMPLANT
SLEEVE SCD COMPRESS KNEE MED (MISCELLANEOUS) ×3 IMPLANT
SPONGE LAP 4X18 X RAY DECT (DISPOSABLE) ×3 IMPLANT
STRIP CLOSURE SKIN 1/2X4 (GAUZE/BANDAGES/DRESSINGS) ×2 IMPLANT
SUCTION FRAZIER TIP 10 FR DISP (SUCTIONS) IMPLANT
SUT 2 FIBERLOOP 20 STRT BLUE (SUTURE) ×3
SUT ETHILON 3 0 PS 1 (SUTURE) IMPLANT
SUT FIBERWIRE #2 38 T-5 BLUE (SUTURE)
SUT MNCRL AB 4-0 PS2 18 (SUTURE) IMPLANT
SUT MON AB 2-0 CT1 36 (SUTURE) IMPLANT
SUT PROLENE 3 0 PS 2 (SUTURE) IMPLANT
SUT VIC AB 0 SH 27 (SUTURE) ×3 IMPLANT
SUT VIC AB 2-0 SH 27 (SUTURE)
SUT VIC AB 2-0 SH 27XBRD (SUTURE) IMPLANT
SUT VIC AB 3-0 SH 27 (SUTURE)
SUT VIC AB 3-0 SH 27X BRD (SUTURE) IMPLANT
SUT VICRYL 4-0 PS2 18IN ABS (SUTURE) IMPLANT
SUTURE 2 FIBERLOOP 20 STRT BLU (SUTURE) ×1 IMPLANT
SUTURE FIBERWR #2 38 T-5 BLUE (SUTURE) IMPLANT
SUTURE TIGERSTICK 2 TIGERWIR 2 (MISCELLANEOUS) ×1 IMPLANT
TAPE CLOTH 3X10 TAN LF (GAUZE/BANDAGES/DRESSINGS) ×3 IMPLANT
TIGERSTICK 2 TIGERWIRE 2 (MISCELLANEOUS) ×3
TISSUE GRAFTLINK FGL (Tissue) ×3 IMPLANT
TOWEL OR 17X24 6PK STRL BLUE (TOWEL DISPOSABLE) ×3 IMPLANT
TOWEL OR NON WOVEN STRL DISP B (DISPOSABLE) ×3 IMPLANT
WAND STAR VAC 90 (SURGICAL WAND) ×3 IMPLANT
WATER STERILE IRR 1000ML POUR (IV SOLUTION) ×3 IMPLANT

## 2014-06-09 NOTE — Anesthesia Procedure Notes (Addendum)
Anesthesia Regional Block:  Femoral nerve block  Pre-Anesthetic Checklist: ,, timeout performed, Correct Patient, Correct Site, Correct Laterality, Correct Procedure, Correct Position, site marked, Risks and benefits discussed,  Surgical consent,  Pre-op evaluation,  At surgeon's request and post-op pain management  Laterality: Left  Prep: chloraprep       Needles:   Needle Type: Other     Needle Length: 9cm 9 cm Needle Gauge: 21 and 21 G    Additional Needles:  Procedures: ultrasound guided (picture in chart) Femoral nerve block Narrative:  Start time: 06/09/2014 12:56 PM End time: 06/09/2014 1:02 PM Injection made incrementally with aspirations every 5 mL.  Performed by: Personally  Anesthesiologist: C. Frederick MD  Additional Notes: Ultrasound guidance used to: id relevant anatomy, confirm needle position, local anesthetic spread, avoidance of vascular puncture. Picture saved. No complications. Block performed personally by Janetta Horaharles E. Gelene MinkFrederick, MD     Procedure Name: LMA Insertion Date/Time: 06/09/2014 1:59 PM Performed by: Irwin DesanctisLINKA, Gianelle Mccaul L Pre-anesthesia Checklist: Patient identified, Emergency Drugs available, Suction available, Patient being monitored and Timeout performed Patient Re-evaluated:Patient Re-evaluated prior to inductionOxygen Delivery Method: Circle System Utilized Preoxygenation: Pre-oxygenation with 100% oxygen Intubation Type: IV induction Ventilation: Mask ventilation without difficulty LMA: LMA inserted LMA Size: 4.0 Number of attempts: 1 Airway Equipment and Method: bite block Placement Confirmation: positive ETCO2 and breath sounds checked- equal and bilateral Tube secured with: Tape Dental Injury: Teeth and Oropharynx as per pre-operative assessment

## 2014-06-09 NOTE — Interval H&P Note (Signed)
History and Physical Interval Note:  06/09/2014 12:38 PM  Alexander Jacobs  has presented today for surgery, with the diagnosis of Left Knee Sprain of cruciate ligament of knee, Unspecified monoarthritis, lower leg, Other tear of cartilage or meniscus of knee, current  The various methods of treatment have been discussed with the patient and family. After consideration of risks, benefits and other options for treatment, the patient has consented to  Procedure(s): LEFT KNEE ARTHROSCOPY WITH ANTERIOR CRUCIATE LIGAMENT (ACL) REPAIR, ABRASION ARTHROPLASTY/MULTIPLE DRILLING, MENISCECTOMY MEDIAL AND LATERAL  (Left) as a surgical intervention .  The patient's history has been reviewed, patient examined, no change in status, stable for surgery.  I have reviewed the patient's chart and labs.  Questions were answered to the patient's satisfaction.     Zayne Draheim, D

## 2014-06-09 NOTE — Transfer of Care (Signed)
Immediate Anesthesia Transfer of Care Note  Patient: Alexander Jacobs  Procedure(s) Performed: Procedure(s): LEFT KNEE ARTHROSCOPY WITH ANTERIOR CRUCIATE LIGAMENT (ACL) REPAIR WITH ALLOGRAFT, PARTIAL MEDIAL AND LATERAL MENISCECTOMY, CHONDROPLASTY (Left)  Patient Location: PACU  Anesthesia Type:GA combined with regional for post-op pain  Level of Consciousness: awake, alert  and patient cooperative  Airway & Oxygen Therapy: Patient Spontanous Breathing and Patient connected to face mask oxygen  Post-op Assessment: Report given to PACU RN and Post -op Vital signs reviewed and stable  Post vital signs: Reviewed and stable  Complications: No apparent anesthesia complications

## 2014-06-09 NOTE — Anesthesia Postprocedure Evaluation (Signed)
Anesthesia Post Note  Patient: Alexander Jacobs  Procedure(s) Performed: Procedure(s) (LRB): LEFT KNEE ARTHROSCOPY WITH ANTERIOR CRUCIATE LIGAMENT (ACL) REPAIR WITH ALLOGRAFT, PARTIAL MEDIAL AND LATERAL MENISCECTOMY, CHONDROPLASTY (Left)  Anesthesia type: General  Patient location: PACU  Post pain: Pain level controlled  Post assessment: Patient's Cardiovascular Status Stable  Last Vitals:  Filed Vitals:   06/09/14 1630  BP: 134/72  Pulse: 69  Temp:   Resp: 15    Post vital signs: Reviewed and stable  Level of consciousness: alert  Complications: No apparent anesthesia complications

## 2014-06-09 NOTE — Progress Notes (Signed)
Assisted Dr. Gelene MinkFrederick with left, ultrasound guided, femoral block. Side rails up, monitors on throughout procedure. See vital signs in flow sheet. Tolerated Procedure well.

## 2014-06-09 NOTE — Op Note (Signed)
06/09/2014  3:26 PM  PATIENT:  Alexander Jacobs    PRE-OPERATIVE DIAGNOSIS:  Left Knee Sprain of cruciate ligament of knee, Unspecified monoarthritis, lower leg, Other tear of cartilage or meniscus of knee, current  POST-OPERATIVE DIAGNOSIS:  Same  PROCEDURE:  LEFT KNEE ARTHROSCOPY WITH ANTERIOR CRUCIATE LIGAMENT (ACL) REPAIR WITH ALLOGRAFT, PARTIAL MEDIAL AND LATERAL MENISCECTOMY, CHONDROPLASTY  SURGEON:  Warren Kugelman, D, MD  ASSISTANT:  Janace LittenBrandon Parry OPA  ANESTHESIA:   General  PREOPERATIVE INDICATIONS:  Alexander Jacobs is a  33 y.o. male with a diagnosis of Left Knee Sprain of cruciate ligament of knee, Unspecified monoarthritis, lower leg, Other tear of cartilage or meniscus of knee, current who failed conservative measures and elected for surgical management.    The risks benefits and alternatives were discussed with the patient preoperatively including but not limited to the risks of infection, bleeding, nerve injury, stiffness, cardiopulmonary complications, the need for revision surgery, recurrent instability, progression of arthritis, the potential for use of a allograft and related disease transmission risks, among others and the patient was willing to proceed.  .  OPERATIVE IMPLANTS: Arthrex anterior cruciate ligament Graft link dual tight rope  OPERATIVE FINDINGS: The anterior cruciate ligament was completely torn. The PCL was intact. The posterior lateral corner was intact to dial testing. There was evidence of an old lateral meniscus tear that had scared in with a new radial component. The majority of the meniscus was stable. There was a small parrot beak medial tear.  It is very narrow grade 3 couch in the center of the femoral notch again this is extremely narrow. He had some grade 2-3 changes in a discrete area and the femoral condyle on the medial side was stable cartilage around it.   OPERATIVE PROCEDURE: The patient was brought to the operating room and placed in the  supine position. General anesthesia was administered. IV antibiotics were given. The lower extremity was prepped and draped in usual sterile fashion. Exam under anesthesia demonstrated the above-named findings. Time out was performed.  The leg was elevated and exsanguinated and the tourniquet was inflated. Incision was made over the proximal tibia.   The graft leg allograft was thawed opened and prepared for transfer to the leg.  Knee arthroscopy was then performed, and the above named findings were noted.    The anterior cruciate ligament however was torn.  I performed a chondroplasty the femoral condyle and the medial side and the femoral notch the medial femoral condyle. There was a not enough exposed bone or unstable cartilage toward the microfracture. I performed a small partial meniscectomy on the medial side back to a stable rim posterior horn was stable. On the lateral side spent quite a time examining the meniscus I feel there is an old tear that had scarred in. It was very stable nothing could be delivered into the knee. There is a small radial component in the mid body that I debrided and tapered into a stable rim.  I then removed the previous anterior cruciate ligament stump, and performed a mild notchplasty.  The outside in guide was then applied to the appropriate position and the retro-cutter was used to drill the femoral socket. Care was taken to maintain the cortical bridge.  I then drilled the tibial tunnel using the retro-cutter, maintaining the outer cortex. All the soft tissue remnants were removed and cleaned at the aperture of the tunnel.  The passing suture was delivered through the medial portal and the through the femoral tunnel. The  Endobutton was directly visualized it as it entered the femoral tunnel and flipped.   I then tensioned the anterior cruciate ligament tightrope, and deliver the graft up into the femoral tunnel. I then passed the passing stitch through the  medial portal and out the tibial tunnel. I then placed the Endobutton disc within the suture and walking down to the tibia I confirm that it sat flush on the bone. I then used this to tension the graft into the knee and down into the tunnel. I directly visualize the tension of the graft. I then cycled the knee 15 times and tension the graft again. I then cycled again placed a posterior drawer at 30 and tension one last time.  Excellent fixation was achieved on both the femoral and tibial side, and the wounds were irrigated copiously and the sartorius fascia repaired with Vicryl, and the portals repaired with Monocryl with Steri-Strips and sterile gauze.  The patient was awakened and returned to PACU in stable and satisfactory condition. There were no complications and He tolerated the procedure well.  Post Operative plan: The patient will be weightbearing as tolerated in a knee immobilizer full time. If under 18 DVT prophylaxis will consist of early ambulation. If over 18 he will consist of early ambulation and aspirin 81 mg once a day.

## 2014-06-09 NOTE — Discharge Instructions (Signed)
Bear weight as tolerated in the brace  Take aspirin 325 daily  Leave dressing on and keep it clean and dry for 5 days.  After 5 days you may shower.  Place bandaids over incision site.   Wear the knee immobilizer at all times.  Stitches will be removed at followup appointment.  Call office for followup appointment one week  . Post Anesthesia Home Care Instructions  Activity: Get plenty of rest for the remainder of the day. A responsible adult should stay with you for 24 hours following the procedure.  For the next 24 hours, DO NOT: -Drive a car -Advertising copywriterperate machinery -Drink alcoholic beverages -Take any medication unless instructed by your physician -Make any legal decisions or sign important papers.  Meals: Start with liquid foods such as gelatin or soup. Progress to regular foods as tolerated. Avoid greasy, spicy, heavy foods. If nausea and/or vomiting occur, drink only clear liquids until the nausea and/or vomiting subsides. Call your physician if vomiting continues.  Special Instructions/Symptoms: Your throat may feel dry or sore from the anesthesia or the breathing tube placed in your throat during surgery. If this causes discomfort, gargle with warm salt water. The discomfort should disappear within 24 hours.   Regional Anesthesia Blocks  1. Numbness or the inability to move the "blocked" extremity may last from 3-48 hours after placement. The length of time depends on the medication injected and your individual response to the medication. If the numbness is not going away after 48 hours, call your surgeon.  2. The extremity that is blocked will need to be protected until the numbness is gone and the  Strength has returned. Because you cannot feel it, you will need to take extra care to avoid injury. Because it may be weak, you may have difficulty moving it or using it. You may not know what position it is in without looking at it while the block is in effect.  3. For blocks in  the legs and feet, returning to weight bearing and walking needs to be done carefully. You will need to wait until the numbness is entirely gone and the strength has returned. You should be able to move your leg and foot normally before you try and bear weight or walk. You will need someone to be with you when you first try to ensure you do not fall and possibly risk injury.  4. Bruising and tenderness at the needle site are common side effects and will resolve in a few days.  5. Persistent numbness or new problems with movement should be communicated to the surgeon or the [facility] [phone])/ [facility] [phone]).

## 2014-06-09 NOTE — Anesthesia Preprocedure Evaluation (Signed)
Anesthesia Evaluation  Patient identified by MRN, date of birth, ID band Patient awake    Reviewed: Allergy & Precautions, H&P , NPO status , Patient's Chart, lab work & pertinent test results, reviewed documented beta blocker date and time   Airway Mallampati: II TM Distance: >3 FB Neck ROM: full    Dental   Pulmonary neg pulmonary ROS,  breath sounds clear to auscultation        Cardiovascular negative cardio ROS  Rhythm:regular     Neuro/Psych negative neurological ROS  negative psych ROS   GI/Hepatic negative GI ROS, Neg liver ROS,   Endo/Other  negative endocrine ROS  Renal/GU negative Renal ROS  negative genitourinary   Musculoskeletal   Abdominal   Peds  Hematology negative hematology ROS (+)   Anesthesia Other Findings See surgeon's H&P   Reproductive/Obstetrics negative OB ROS                           Anesthesia Physical Anesthesia Plan  ASA: II  Anesthesia Plan: General   Post-op Pain Management:    Induction: Intravenous  Airway Management Planned: LMA  Additional Equipment:   Intra-op Plan:   Post-operative Plan:   Informed Consent: I have reviewed the patients History and Physical, chart, labs and discussed the procedure including the risks, benefits and alternatives for the proposed anesthesia with the patient or authorized representative who has indicated his/her understanding and acceptance.   Dental Advisory Given  Plan Discussed with: CRNA and Surgeon  Anesthesia Plan Comments:         Anesthesia Quick Evaluation  

## 2014-06-12 ENCOUNTER — Encounter (HOSPITAL_BASED_OUTPATIENT_CLINIC_OR_DEPARTMENT_OTHER): Payer: Self-pay | Admitting: Orthopedic Surgery

## 2015-04-03 ENCOUNTER — Emergency Department (HOSPITAL_COMMUNITY)
Admission: EM | Admit: 2015-04-03 | Discharge: 2015-04-03 | Disposition: A | Discharge status: Home / Self Care | Payer: No Typology Code available for payment source | Attending: Emergency Medicine | Admitting: Emergency Medicine

## 2015-04-03 ENCOUNTER — Emergency Department (HOSPITAL_COMMUNITY): Payer: No Typology Code available for payment source

## 2015-04-03 ENCOUNTER — Encounter (HOSPITAL_COMMUNITY): Payer: Self-pay | Admitting: Emergency Medicine

## 2015-04-03 DIAGNOSIS — Z79899 Other long term (current) drug therapy: Secondary | ICD-10-CM | POA: Insufficient documentation

## 2015-04-03 DIAGNOSIS — Z87828 Personal history of other (healed) physical injury and trauma: Secondary | ICD-10-CM | POA: Insufficient documentation

## 2015-04-03 DIAGNOSIS — R0602 Shortness of breath: Secondary | ICD-10-CM | POA: Diagnosis present

## 2015-04-03 DIAGNOSIS — Z7982 Long term (current) use of aspirin: Secondary | ICD-10-CM | POA: Diagnosis not present

## 2015-04-03 DIAGNOSIS — J111 Influenza due to unidentified influenza virus with other respiratory manifestations: Secondary | ICD-10-CM | POA: Diagnosis not present

## 2015-04-03 LAB — COMPREHENSIVE METABOLIC PANEL
ALBUMIN: 3.6 g/dL (ref 3.5–5.2)
ALT: 18 U/L (ref 0–53)
AST: 23 U/L (ref 0–37)
Alkaline Phosphatase: 41 U/L (ref 39–117)
Anion gap: 8 (ref 5–15)
BILIRUBIN TOTAL: 0.5 mg/dL (ref 0.3–1.2)
BUN: 14 mg/dL (ref 6–23)
CO2: 25 mmol/L (ref 19–32)
Calcium: 8.3 mg/dL — ABNORMAL LOW (ref 8.4–10.5)
Chloride: 101 mmol/L (ref 96–112)
Creatinine, Ser: 1.1 mg/dL (ref 0.50–1.35)
GFR calc Af Amer: >90 mL/min (ref >90)
GFR, EST NON AFRICAN AMERICAN: 88 mL/min — AB (ref >90)
GLUCOSE: 96 mg/dL (ref 70–99)
POTASSIUM: 4 mmol/L (ref 3.5–5.1)
SODIUM: 134 mmol/L — AB (ref 135–145)
Total Protein: 6.2 g/dL (ref 6.0–8.3)

## 2015-04-03 LAB — CBC WITH DIFFERENTIAL/PLATELET
BASOS ABS: 0 10*3/uL (ref 0.0–0.1)
Basophils Relative: 1 % (ref 0–1)
Eosinophils Absolute: 0 10*3/uL (ref 0.0–0.7)
Eosinophils Relative: 1 % (ref 0–5)
HEMATOCRIT: 45.1 % (ref 39.0–52.0)
Hemoglobin: 15.4 g/dL (ref 13.0–17.0)
LYMPHS ABS: 1.1 10*3/uL (ref 0.7–4.0)
LYMPHS PCT: 30 % (ref 12–46)
MCH: 30.5 pg (ref 26.0–34.0)
MCHC: 34.1 g/dL (ref 30.0–36.0)
MCV: 89.3 fL (ref 78.0–100.0)
MONO ABS: 0.5 10*3/uL (ref 0.1–1.0)
Monocytes Relative: 14 % — ABNORMAL HIGH (ref 3–12)
Neutro Abs: 2.1 10*3/uL (ref 1.7–7.7)
Neutrophils Relative %: 54 % (ref 43–77)
PLATELETS: 171 10*3/uL (ref 150–400)
RBC: 5.05 MIL/uL (ref 4.22–5.81)
RDW: 12.5 % (ref 11.5–15.5)
WBC: 3.8 10*3/uL — AB (ref 4.0–10.5)

## 2015-04-03 MED ORDER — IBUPROFEN 800 MG PO TABS
800.0000 mg | ORAL_TABLET | Freq: Once | ORAL | Status: AC
  Administered 2015-04-03: 800 mg via ORAL
  Filled 2015-04-03: qty 1

## 2015-04-03 NOTE — ED Notes (Addendum)
The patient says he has been having SOB, coughing, diarrhea, fever and body aches.  He said they started saturday and he tried to treat them with OTC.  The patient said it is getting worse so he decided to come in and be seen.  He also added that it hurts his back to take a breath.  The patient does work in a nursing home.

## 2015-04-03 NOTE — Discharge Instructions (Signed)
Influenza Mr. Alexander Jacobs, Your blood work and chest xray were normal.  See a primary physician within 3 days for follow up.  Take motrin 800mg , three times a day for pain and fever.  If symptoms worsen, come back to the Emergency Department immediately. THank you. Influenza (flu) is an infection in the mouth, nose, and throat (respiratory tract) caused by a virus. The flu can make you feel very ill. Influenza spreads easily from person to person (contagious).  HOME CARE   Only take medicines as told by your doctor.  Use a cool mist humidifier to make breathing easier.  Get plenty of rest until your fever goes away. This usually takes 3 to 4 days.  Drink enough fluids to keep your pee (urine) clear or pale yellow.  Cover your mouth and nose when you cough or sneeze.  Wash your hands well to avoid spreading the flu.  Stay home from work or school until your fever has been gone for at least 1 full day.  Get a flu shot every year. GET HELP RIGHT AWAY IF:   You have trouble breathing or feel short of breath.  Your skin or nails turn blue.  You have severe neck pain or stiffness.  You have a severe headache, facial pain, or earache.  Your fever gets worse or keeps coming back.  You feel sick to your stomach (nauseous), throw up (vomit), or have watery poop (diarrhea).  You have chest pain.  You have a deep cough that gets worse, or you cough up more thick spit (mucus). MAKE SURE YOU:   Understand these instructions.  Will watch your condition.  Will get help right away if you are not doing well or get worse. Document Released: 09/23/2008 Document Revised: 05/01/2014 Document Reviewed: 03/15/2012 [facility] Patient Information 2015 [employer], [place]. This information is not intended to replace advice given to you by your health care provider. Make sure you discuss any questions you have with your health care provider.

## 2015-04-03 NOTE — ED Provider Notes (Signed)
CSN: [id number]     Arrival date & time 04/03/15  0027 History   First MD Initiated Contact with Patient 04/03/15 0422     Chief Complaint  Patient presents with  . Shortness of Breath    The patient says he has been having SOB, coughing, diarrhea, fever and body aches.  He said they started saturday and he tried to treat them with OTC.       (Consider location/radiation/quality/duration/timing/severity/associated sxs/prior Treatment) HPI Mr. Konrad DoloresLester is a 34yo male, no sig PMH, here with fever, chills for the past couple of days.  Nothing has made it better or worse.  He has sick contacts int he house with similar symptoms.  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  He reports SOB, non productive cough and diarrhea.  He has diffuse body aches as well.  He denies having this in the past.  He has no further complaints.  10 Systems reviewed and are negative for acute change except as noted in the HPI.    Past Medical History  Diagnosis Date  . ACL sprain 05/2014    left   Past Surgical History  Procedure Laterality Date  . Tonsillectomy    . Knee arthroscopy with anterior cruciate ligament (acl) repair with hamstring graft Left 06/09/2014    Procedure: LEFT KNEE ARTHROSCOPY WITH ANTERIOR CRUCIATE LIGAMENT (ACL) REPAIR WITH ALLOGRAFT, PARTIAL MEDIAL AND LATERAL MENISCECTOMY, CHONDROPLASTY;  Surgeon: Sheral Apleyimothy D Murphy, MD;  Location: [facility];  Service: Orthopedics;  Laterality: Left;   History reviewed. No pertinent family history. History  Substance Use Topics  . Smoking status: Never Smoker   . Smokeless tobacco: Never Used  . Alcohol Use: No    Review of Systems    Allergies  Percocet  Home Medications   Prior to Admission medications   Medication Sig Start Date End Date Taking? Authorizing Provider  Ascorbic Acid (VITAMIN C) 100 MG tablet Take 100 mg by mouth daily.   Yes Historical Provider, MD  dextromethorphan (DELSYM) 30 MG/5ML  liquid Take 60 mg by mouth as needed for cough.   Yes Historical Provider, MD  ibuprofen (ADVIL,MOTRIN) 200 MG tablet Take 400 mg by mouth every 6 (six) hours as needed for moderate pain.   Yes Historical Provider, MD  phenylephrine (SUDAFED PE) 10 MG TABS tablet Take 10 mg by mouth every 4 (four) hours as needed (congestion, cold).   Yes Historical Provider, MD  aspirin 81 MG tablet Take 1 tablet (81 mg total) by mouth daily. Patient not taking: Reported on 04/03/2015 06/09/14   Sheral Apleyimothy D Murphy, MD  docusate sodium (COLACE) 100 MG capsule Take 1 capsule (100 mg total) by mouth 2 (two) times daily. Patient not taking: Reported on 04/03/2015 06/09/14   Sheral Apleyimothy D Murphy, MD  ibuprofen (ADVIL,MOTRIN) 600 MG tablet Take 1 tablet (600 mg total) by mouth every 6 (six) hours as needed. Patient not taking: Reported on 04/03/2015 04/29/14   Marissa Sciacca, PA-C  ondansetron (ZOFRAN) 4 MG tablet Take 1 tablet (4 mg total) by mouth every 8 (eight) hours as needed for nausea or vomiting. Patient not taking: Reported on 04/03/2015 06/09/14   Sheral Apleyimothy D Murphy, MD  oxyCODONE (OXY IR/ROXICODONE) 5 MG immediate release tablet Take 2 tablets (10 mg total) by mouth every 4 (four) hours as needed for severe pain. Patient not taking: Reported on 04/03/2015 06/09/14   Sheral Apleyimothy D Murphy, MD   BP 130/73 mmHg  Pulse  79  Temp(Src) 98.4 F (36.9 C) (Oral)  Resp 18  SpO2 99% Physical Exam  Constitutional: He is oriented to person, place, and time. Vital signs are normal. He appears well-developed and well-nourished.  Non-toxic appearance. He does not appear ill. No distress.  HENT:  Head: Normocephalic and atraumatic.  Nose: Nose normal.  Mouth/Throat: Oropharynx is clear and moist. No oropharyngeal exudate.  Eyes: Conjunctivae and EOM are normal. Pupils are equal, round, and reactive to light. No scleral icterus.  Neck: Normal range of motion. Neck supple. No tracheal deviation, no edema, no erythema and normal range of motion  present. No thyroid mass and no thyromegaly present.  Cardiovascular: Normal rate, regular rhythm, S1 normal, S2 normal, normal heart sounds, intact distal pulses and normal pulses.  Exam reveals no gallop and no friction rub.   No murmur heard. Pulses:      Radial pulses are 2+ on the right side, and 2+ on the left side.       Dorsalis pedis pulses are 2+ on the right side, and 2+ on the left side.  Pulmonary/Chest: Effort normal and breath sounds normal. No respiratory distress. He has no wheezes. He has no rhonchi. He has no rales.  Abdominal: Soft. Normal appearance and bowel sounds are normal. He exhibits no distension, no ascites and no mass. There is no hepatosplenomegaly. There is no tenderness. There is no rebound, no guarding and no CVA tenderness.  Musculoskeletal: Normal range of motion. He exhibits no edema or tenderness.  Lymphadenopathy:    He has no cervical adenopathy.  Neurological: He is alert and oriented to person, place, and time. He has normal strength. No cranial nerve deficit or sensory deficit. He exhibits normal muscle tone.  Skin: Skin is warm, dry and intact. No petechiae and no rash noted. He is not diaphoretic. No erythema. No pallor.  Tactile fever  Psychiatric: He has a normal mood and affect. His behavior is normal. Judgment normal.  Nursing note and vitals reviewed.   ED Course  Procedures (including critical care time) Labs Review Labs Reviewed  CBC WITH DIFFERENTIAL/PLATELET - Abnormal; Notable for the following:    WBC 3.8 (*)    Monocytes Relative 14 (*)    All other components within normal limits  COMPREHENSIVE METABOLIC PANEL - Abnormal; Notable for the following:    Sodium 134 (*)    Calcium 8.3 (*)    GFR calc non Af Amer 88 (*)    All other components within normal limits    Imaging Review Dg Chest 2 View  04/03/2015   CLINICAL DATA:  Shortness of breath  EXAM: CHEST  2 VIEW  COMPARISON:  None.  FINDINGS: Normal heart size and mediastinal  contours. No acute infiltrate or edema. No effusion or pneumothorax. No acute osseous findings.  IMPRESSION: Negative chest.   Electronically Signed   By: Marnee Spring M.D.   On: 04/03/2015 01:51     EKG Interpretation   Date/Time:  Tuesday April 03 2015 00:39:50 EDT Ventricular Rate:  84 PR Interval:  118 QRS Duration: 90 QT Interval:  338 QTC Calculation: 399 R Axis:   92 Text Interpretation:  Normal sinus rhythm Rightward axis Borderline ECG  Confirmed by Erroll Luna [phone]) on 04/03/2015 1:01:40 AM      MDM   Final diagnoses:  None    Patient presents to the ED for fever, body aches, and coughing.  His symptoms are consistent with a viral URI.  He was given  motrin for pain relief and provided patient education.  Primary follow up with advised within 3 days.  His vital signs remain within his normal limits and he is safe for DC.    Tomasita Crumble, MD 04/03/15 [phone]

## 2015-04-03 NOTE — ED Notes (Signed)
Pt. Left with all belongings and refused wheelchair 

## 2015-06-10 IMAGING — CR DG KNEE COMPLETE 4+V*L*
5 series · 5 of 5 positions shown · non-contrast
Comparison: None.

CLINICAL DATA: Medial left knee pain.  Fall 2 days ago.

EXAM:
LEFT KNEE - COMPLETE 4+ VIEW

[t knee ap left]
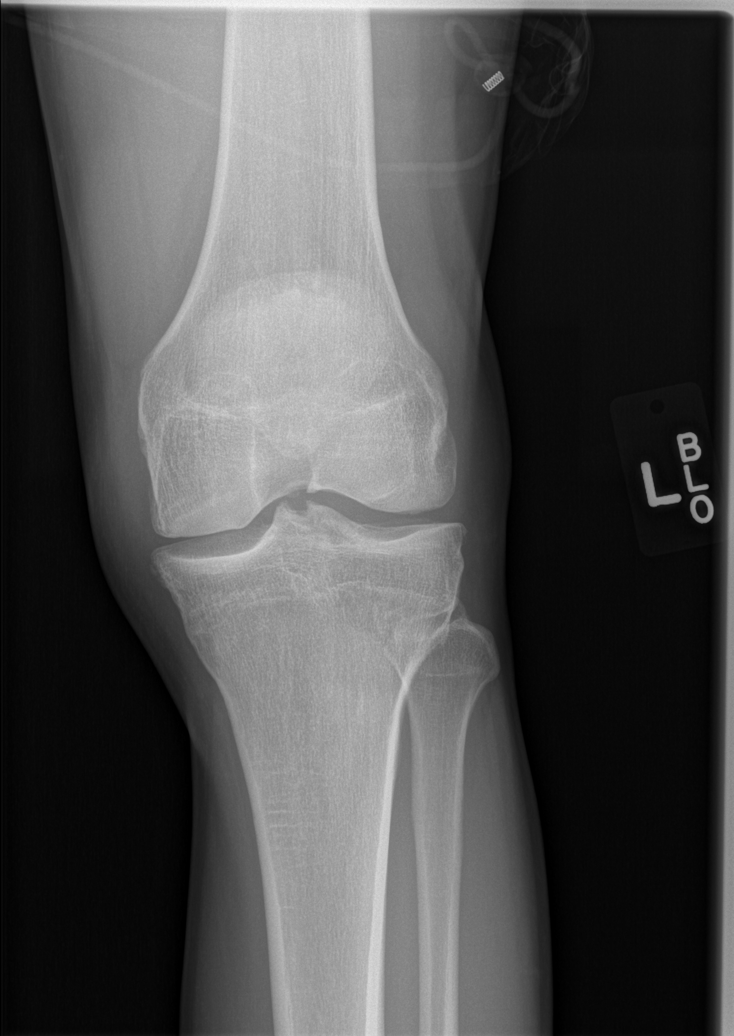

[t knee obl left (1 of 2)]
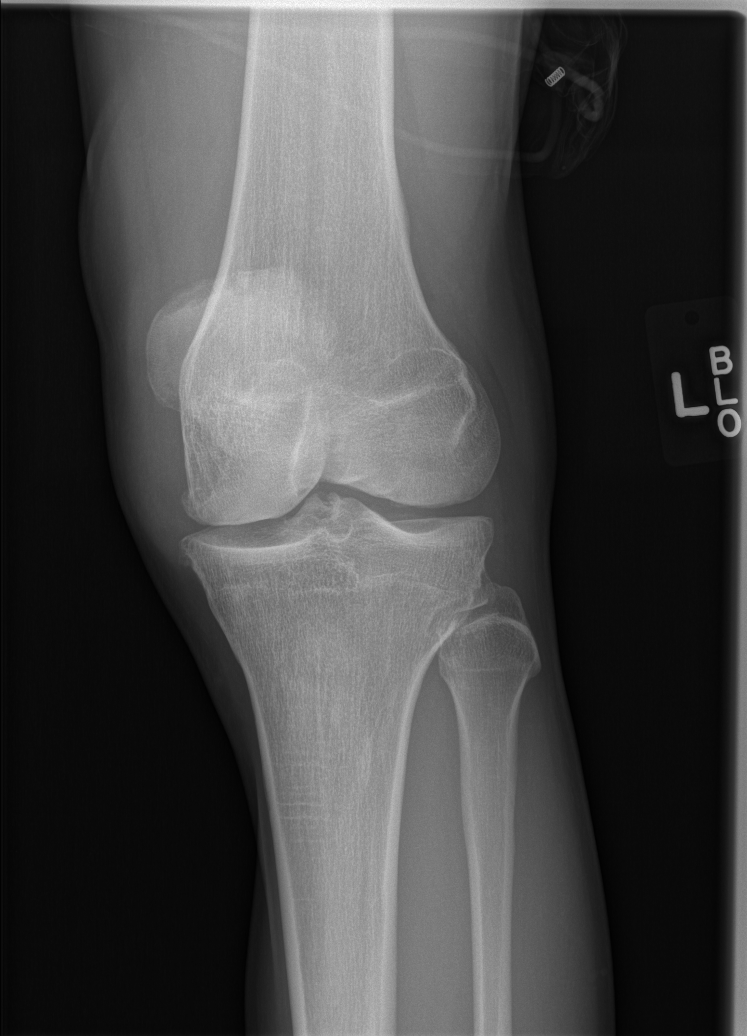

[t knee obl left (2 of 2)]
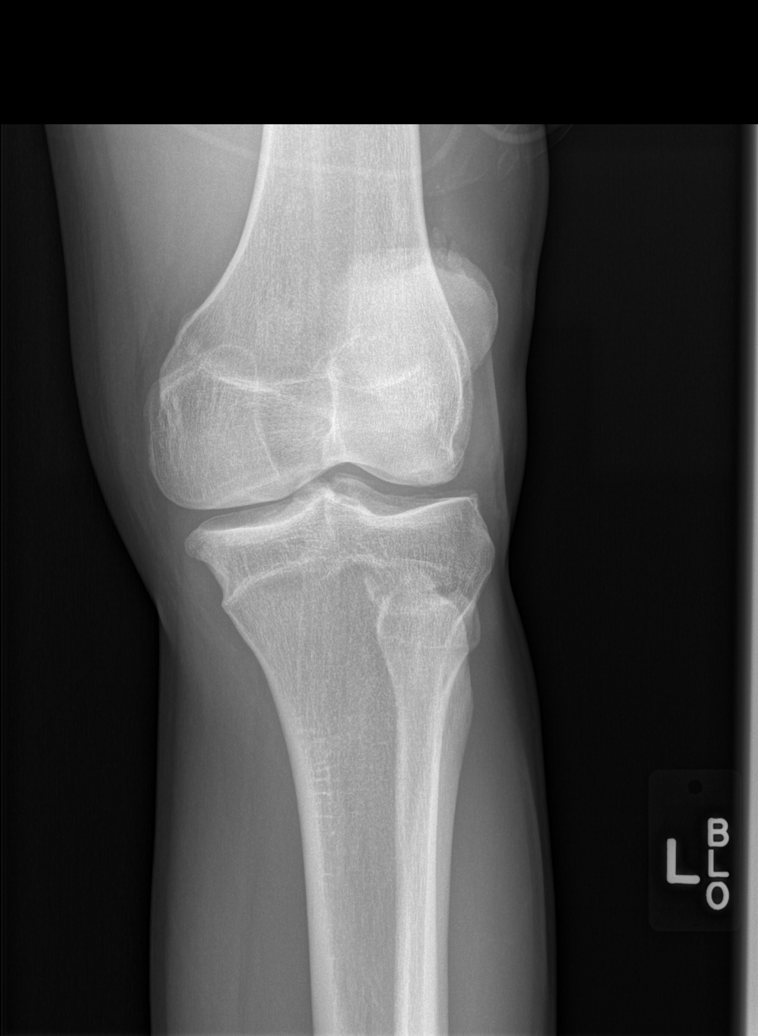

[t knee lat left (1 of 2)]
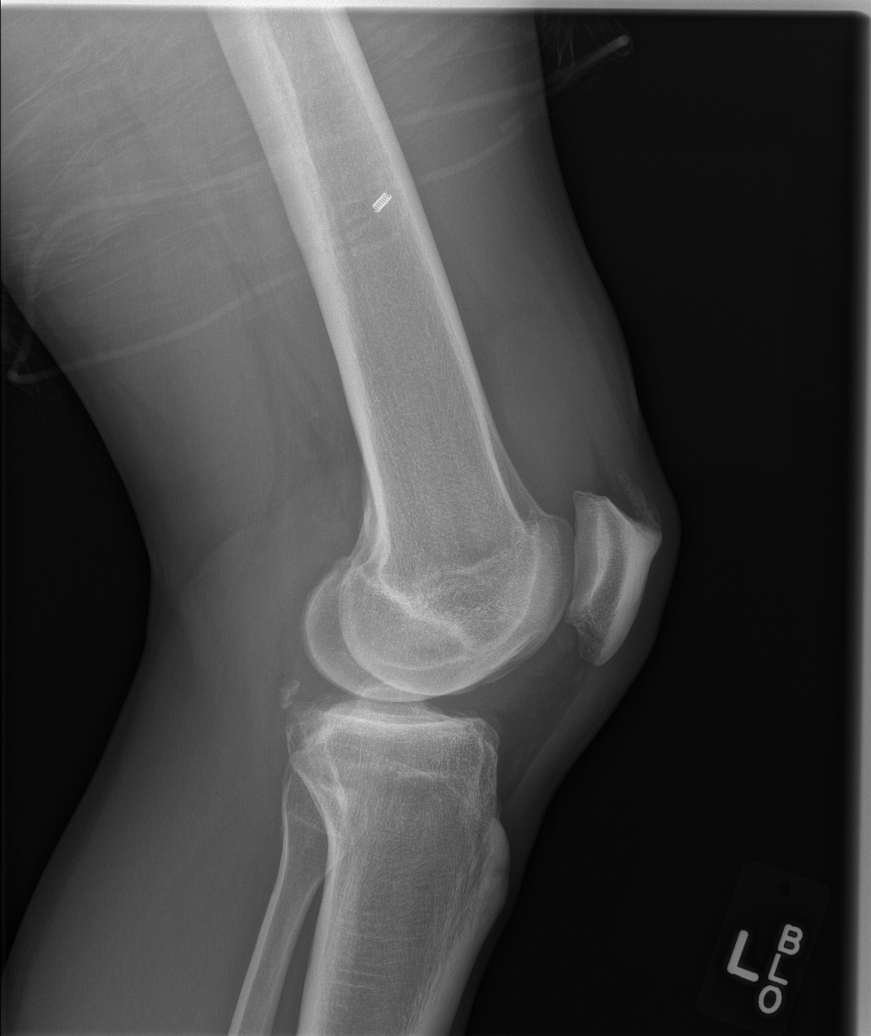

[t knee lat left (2 of 2)]
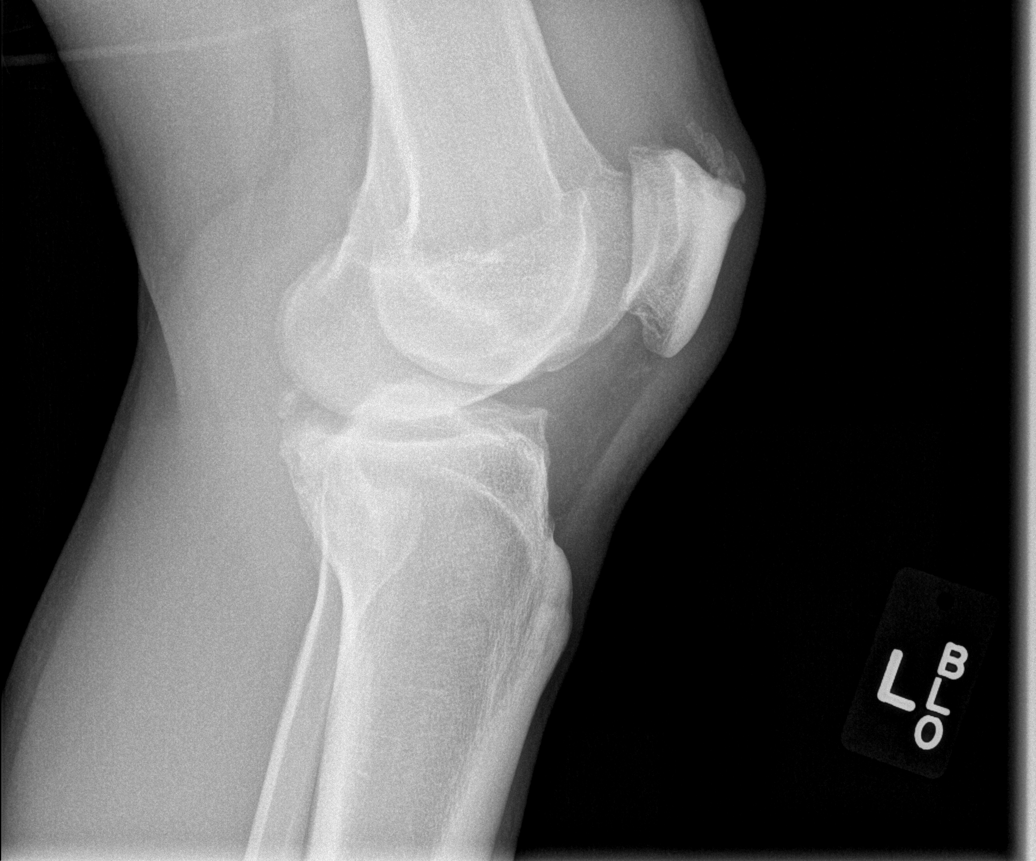

[5 of 5 positions shown; findings below may reference images not displayed]

FINDINGS: No acute fracture or dislocation is identified. There is a
moderately large knee joint effusion. Mild tricompartmental
osteophyte formation is present. A prominent superior patellar
enthesophyte is noted.
IMPRESSION: 1. No acute fracture or dislocation identified.
2. Moderately large knee joint effusion.
3. Mild tricompartmental osteoarthrosis.

## 2015-06-10 IMAGING — CT CT KNEE*L* W/O CM
3 of 8 series · 13 of 36 positions shown, 15 images · non-contrast
Comparison: Radiographs 04/29/2014.

CLINICAL DATA: Knee pain.  Twisting injury.

EXAM:
CT OF THE LEFT KNEE WITHOUT CONTRAST
TECHNIQUE: Multidetector CT imaging was performed according to the standard
protocol. Multiplanar CT image reconstructions were also generated.

[Series 5: knee st · axial · 0.36mm/px · z∈[-260,-108]mm · 5 of 114 slices shown, 7 images]
[im 19/114  soft-tissue]
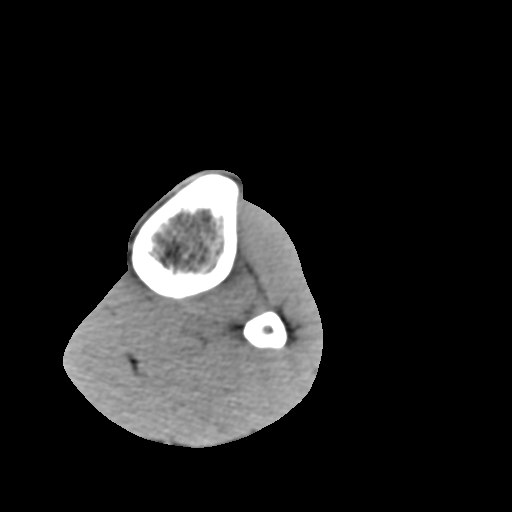
[im 19/114  bone]
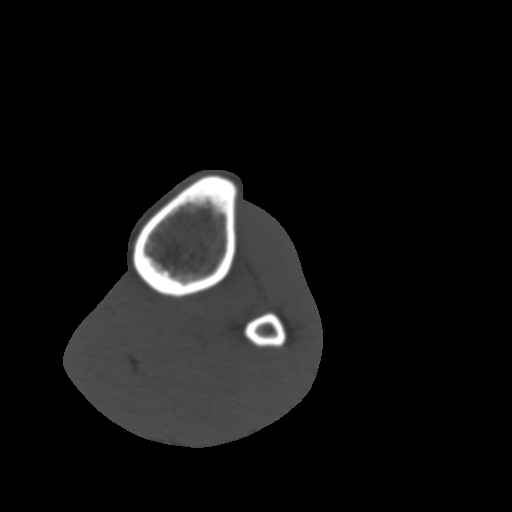
[im 38/114  bone]
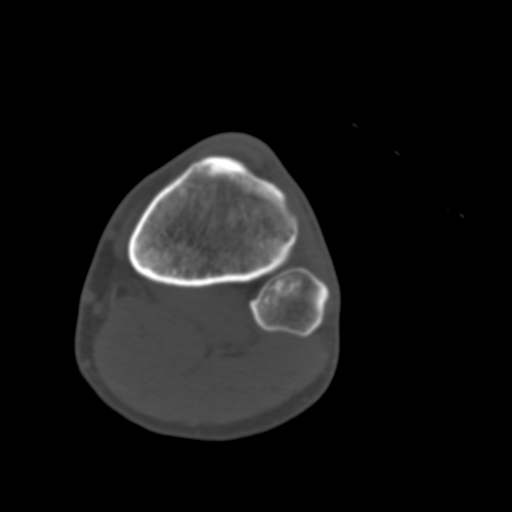
[im 57/114  bone]
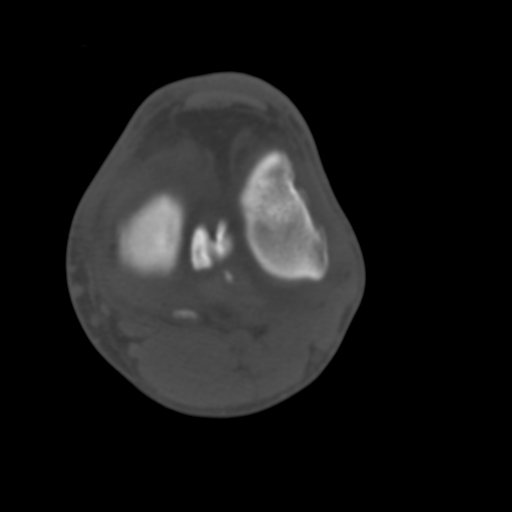
[im 76/114  bone]
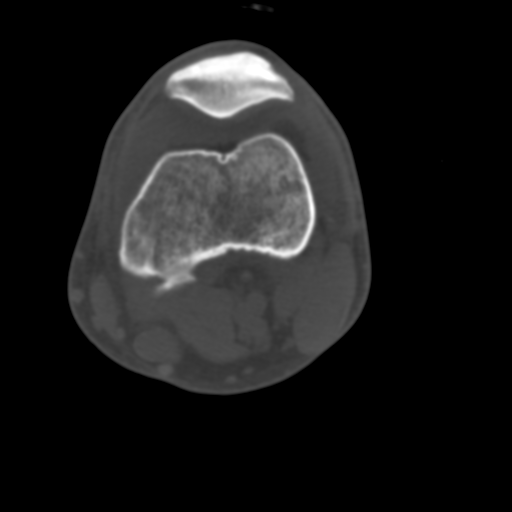
[im 95/114  soft-tissue]
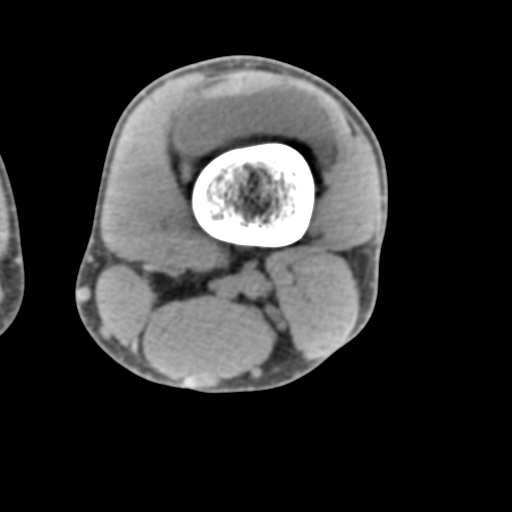
[im 95/114  bone]
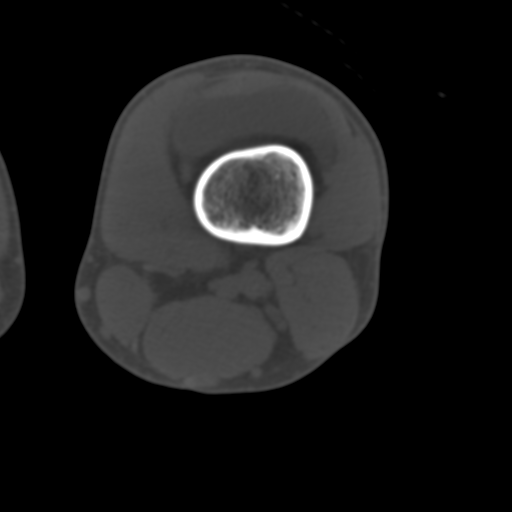

[Series 8: knee bone windows · axial · 0.36mm/px · z∈[-260,-108]mm · 5 of 114 slices shown]
[im 19/114  bone]
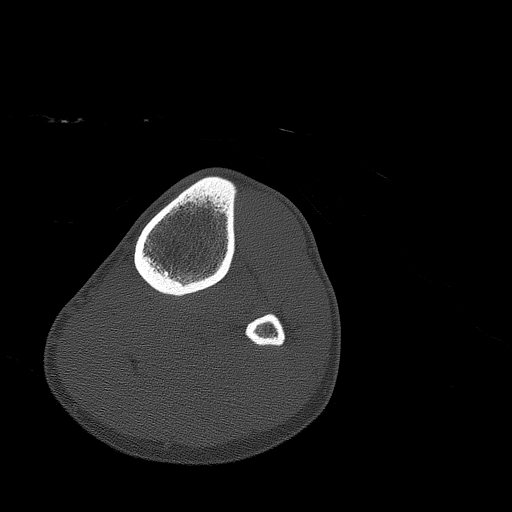
[im 38/114  bone]
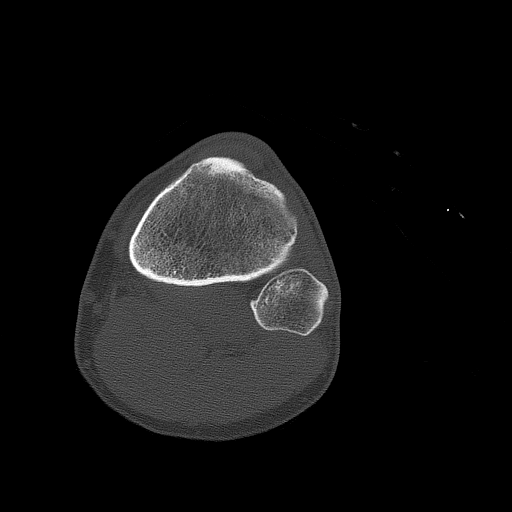
[im 57/114  bone]
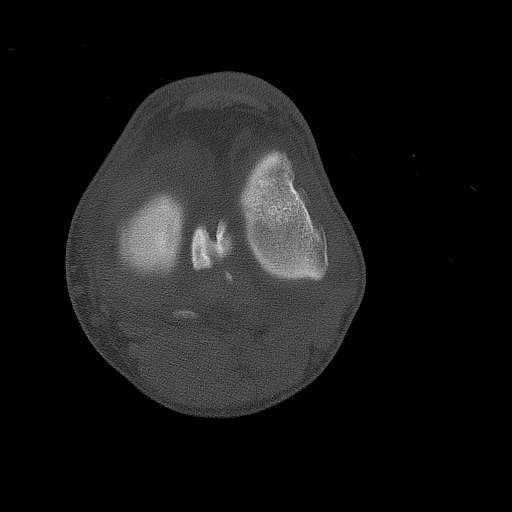
[im 76/114  bone]
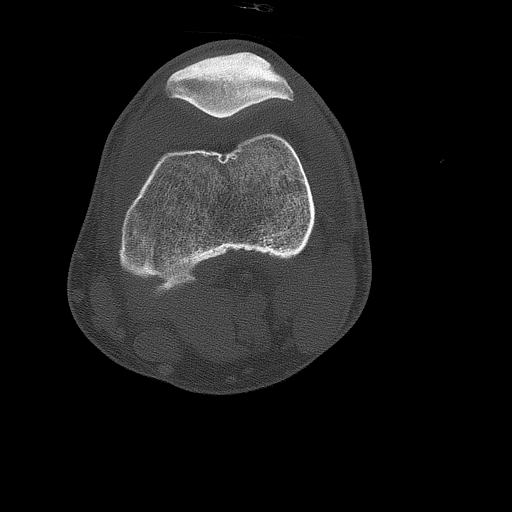
[im 95/114  bone]
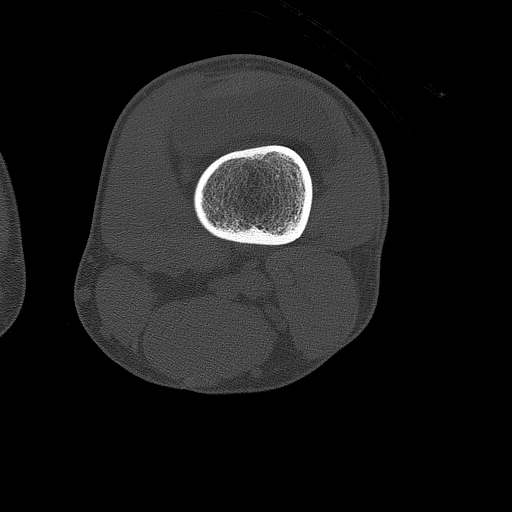

[Series 605: <mpr range(1)> · sagittal · 0.45mm/px · 3 of 60 slices shown]
[im 17/60  soft-tissue]
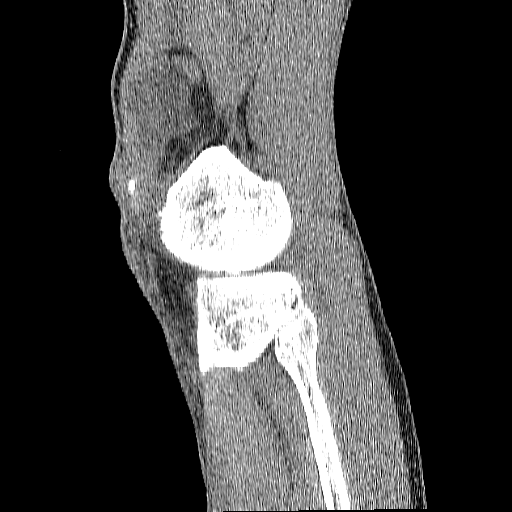
[im 20/60  bone]
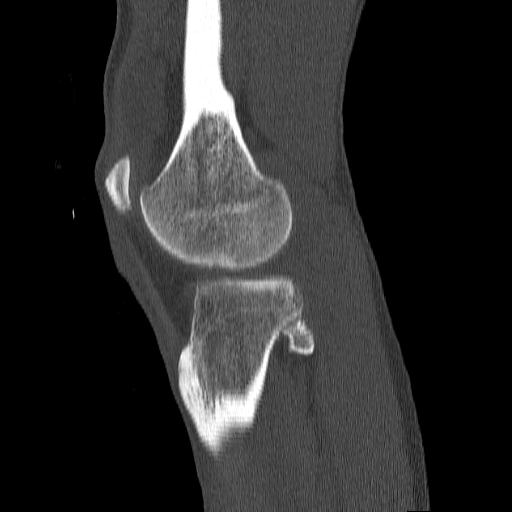
[im 40/60  bone]
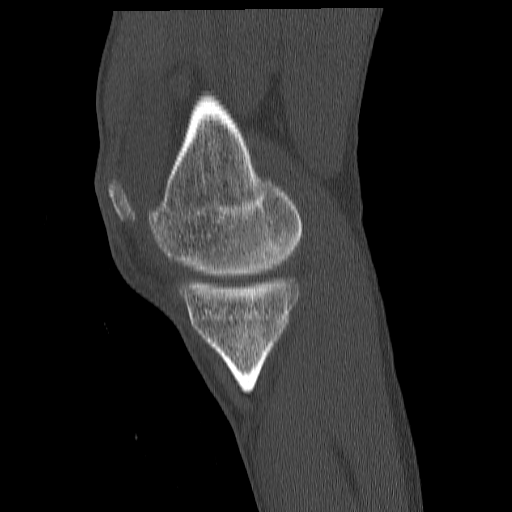

[13 of 36 positions shown; findings below may reference images not displayed]

FINDINGS: Age advanced degenerative changes with joint space narrowing and
osteophytic spurring. No acute fracture is identified. No
osteochondral lesion. There is a moderate to large joint effusion.
Calcifications are noted in the distal quadriceps tendon. No obvious
quadriceps or patellar tendon rupture.
IMPRESSION: No acute bony findings.

Age advanced degenerative changes.

Moderate to large joint effusion.
